# Patient Record
Sex: Female | Born: 1975 | Race: White | Hispanic: No | Marital: Married | Smoking: Never smoker
Health system: Southern US, Community
[De-identification: ages and names within clinical notes are randomized; demographics above are authoritative.]

## PROBLEM LIST (undated history)

## (undated) HISTORY — PX: CHOLECYSTECTOMY: SHX55

## (undated) HISTORY — PX: WISDOM TOOTH EXTRACTION: SHX21

## (undated) HISTORY — PX: TUBAL LIGATION: SHX77

---

## 2002-03-27 ENCOUNTER — Ambulatory Visit (HOSPITAL_COMMUNITY): Admission: RE | Admit: 2002-03-27 | Discharge: 2002-03-27 | Payer: Self-pay | Admitting: Obstetrics and Gynecology

## 2002-03-27 ENCOUNTER — Encounter: Payer: Self-pay | Admitting: Obstetrics and Gynecology

## 2002-04-24 ENCOUNTER — Inpatient Hospital Stay (HOSPITAL_COMMUNITY): Admission: AD | Admit: 2002-04-24 | Discharge: 2002-04-24 | Payer: Self-pay | Admitting: Obstetrics and Gynecology

## 2002-04-24 ENCOUNTER — Encounter: Payer: Self-pay | Admitting: Obstetrics and Gynecology

## 2002-04-24 ENCOUNTER — Ambulatory Visit (HOSPITAL_COMMUNITY): Admission: RE | Admit: 2002-04-24 | Discharge: 2002-04-24 | Payer: Self-pay | Admitting: Obstetrics and Gynecology

## 2002-04-29 ENCOUNTER — Ambulatory Visit (HOSPITAL_COMMUNITY): Admission: RE | Admit: 2002-04-29 | Discharge: 2002-04-29 | Payer: Self-pay | Admitting: Obstetrics and Gynecology

## 2002-06-03 ENCOUNTER — Inpatient Hospital Stay (HOSPITAL_COMMUNITY): Admission: AD | Admit: 2002-06-03 | Discharge: 2002-06-03 | Payer: Self-pay | Admitting: Obstetrics and Gynecology

## 2002-08-01 ENCOUNTER — Inpatient Hospital Stay (HOSPITAL_COMMUNITY): Admission: AD | Admit: 2002-08-01 | Discharge: 2002-08-04 | Payer: Self-pay | Admitting: Obstetrics and Gynecology

## 2002-12-18 ENCOUNTER — Encounter: Payer: Self-pay | Admitting: Family Medicine

## 2002-12-18 ENCOUNTER — Ambulatory Visit (HOSPITAL_COMMUNITY): Admission: RE | Admit: 2002-12-18 | Discharge: 2002-12-18 | Payer: Self-pay | Admitting: Family Medicine

## 2014-09-23 ENCOUNTER — Emergency Department (HOSPITAL_BASED_OUTPATIENT_CLINIC_OR_DEPARTMENT_OTHER)
Admission: EM | Admit: 2014-09-23 | Discharge: 2014-09-23 | Disposition: A | Discharge status: Home / Self Care | Payer: BC Managed Care – PPO | Attending: Emergency Medicine | Admitting: Emergency Medicine

## 2014-09-23 ENCOUNTER — Emergency Department (HOSPITAL_BASED_OUTPATIENT_CLINIC_OR_DEPARTMENT_OTHER): Payer: BC Managed Care – PPO

## 2014-09-23 ENCOUNTER — Encounter (HOSPITAL_BASED_OUTPATIENT_CLINIC_OR_DEPARTMENT_OTHER): Payer: Self-pay | Admitting: *Deleted

## 2014-09-23 DIAGNOSIS — R110 Nausea: Secondary | ICD-10-CM | POA: Insufficient documentation

## 2014-09-23 DIAGNOSIS — R55 Syncope and collapse: Secondary | ICD-10-CM | POA: Diagnosis not present

## 2014-09-23 DIAGNOSIS — R6884 Jaw pain: Secondary | ICD-10-CM | POA: Insufficient documentation

## 2014-09-23 LAB — CBC WITH DIFFERENTIAL/PLATELET
BASOS ABS: 0 10*3/uL (ref 0.0–0.1)
Basophils Relative: 0 % (ref 0–1)
EOS ABS: 0.1 10*3/uL (ref 0.0–0.7)
EOS PCT: 0 % (ref 0–5)
HCT: 45.3 % (ref 36.0–46.0)
Hemoglobin: 15.1 g/dL — ABNORMAL HIGH (ref 12.0–15.0)
LYMPHS ABS: 2.7 10*3/uL (ref 0.7–4.0)
Lymphocytes Relative: 23 % (ref 12–46)
MCH: 26.7 pg (ref 26.0–34.0)
MCHC: 33.3 g/dL (ref 30.0–36.0)
MCV: 80.2 fL (ref 78.0–100.0)
MONO ABS: 0.9 10*3/uL (ref 0.1–1.0)
Monocytes Relative: 8 % (ref 3–12)
Neutro Abs: 8 10*3/uL — ABNORMAL HIGH (ref 1.7–7.7)
Neutrophils Relative %: 69 % (ref 43–77)
Platelets: 299 10*3/uL (ref 150–400)
RBC: 5.65 MIL/uL — ABNORMAL HIGH (ref 3.87–5.11)
RDW: 14.6 % (ref 11.5–15.5)
WBC: 11.7 10*3/uL — ABNORMAL HIGH (ref 4.0–10.5)

## 2014-09-23 LAB — BASIC METABOLIC PANEL
Anion gap: 15 (ref 5–15)
BUN: 16 mg/dL (ref 6–23)
CALCIUM: 9.5 mg/dL (ref 8.4–10.5)
CO2: 21 mEq/L (ref 19–32)
CREATININE: 0.7 mg/dL (ref 0.50–1.10)
Chloride: 102 mEq/L (ref 96–112)
GFR calc Af Amer: >90 mL/min (ref >90)
GLUCOSE: 97 mg/dL (ref 70–99)
Potassium: 3.5 mEq/L — ABNORMAL LOW (ref 3.7–5.3)
SODIUM: 138 meq/L (ref 137–147)

## 2014-09-23 LAB — TROPONIN I

## 2014-09-23 NOTE — ED Provider Notes (Signed)
CSN: [id number]     Arrival date & time 09/23/14  1906 History   First MD Initiated Contact with Patient 09/23/14 1924     Chief Complaint  Patient presents with  . Jaw Pain  . Near Syncope     (Consider location/radiation/quality/duration/timing/severity/associated sxs/prior Treatment) HPI Comments: Patients with history of obesity presents with complaint of near syncope, nausea, and jaw pain approximately 24 hours ago. Patient was working out at [employer] yesterday walking on a treadmill. Patient states that she walked about 3 miles and her heart rate went up to 160. Immediately after completing her workup she felt lightheaded with did not have full syncope. She felt nauseous but did not vomit. She did not have chest pain or shortness of breath. She then went out and ate dinner. After dinner she began to feel nauseous again and had bilateral jaw pain without chest pain or shortness of breath. Symptoms then resolved and have not returned. She became concerned after talking with a coworker today and presents to the emergency department for evaluation. Patient has no risk factors for pulmonary embolism and no history of pulmonary embolism. She does not have a history of hypertension, diabetes, high cholesterol, smoking. Patient has family members on her father's side who had heart disease in their 2260s and 3270s. She has never had symptoms like this while exercising. She has lost 30 pounds with exercising over the past several months.  Patient is a 38 y.o. female presenting with near-syncope. The history is provided by the patient.  Near Syncope Associated symptoms include nausea. Pertinent negatives include no abdominal pain, chest pain, coughing, diaphoresis, fever, neck pain, rash or vomiting.    History reviewed. No pertinent past medical history. Past Surgical History  Procedure Laterality Date  . Cholecystectomy    . Tubal ligation    . Wisdom tooth extraction     No family history on  file. History  Substance Use Topics  . Smoking status: Never Smoker   . Smokeless tobacco: Not on file  . Alcohol Use: No   OB History    No data available     Review of Systems  Constitutional: Negative for fever and diaphoresis.  Eyes: Negative for redness.  Respiratory: Negative for cough and shortness of breath.   Cardiovascular: Positive for near-syncope. Negative for chest pain, palpitations and leg swelling.       Jaw pain  Gastrointestinal: Positive for nausea. Negative for vomiting and abdominal pain.  Genitourinary: Negative for dysuria.  Musculoskeletal: Negative for back pain and neck pain.  Skin: Negative for rash.  Neurological: Positive for light-headedness. Negative for syncope.      Allergies  Review of patient's allergies indicates no known allergies.  Home Medications   Prior to Admission medications   Not on File   BP 147/87 mmHg  Pulse 74  Temp(Src) 98.1 F (36.7 C) (Oral)  Resp 18  Ht 5\' 3"  (1.6 m)  Wt 250 lb (113.399 kg)  BMI 44.30 kg/m2  SpO2 100%  LMP 09/03/2014   Physical Exam  Constitutional: She appears well-developed and well-nourished.  HENT:  Head: Normocephalic and atraumatic.  Mouth/Throat: Mucous membranes are normal. Mucous membranes are not dry.  Eyes: Conjunctivae are normal.  Neck: Trachea normal and normal range of motion. Neck supple. Normal carotid pulses and no JVD present. No muscular tenderness present. Carotid bruit is not present. No tracheal deviation present.  Cardiovascular: Normal rate, regular rhythm, S1 normal, S2 normal, normal heart sounds and intact distal  pulses.  Exam reveals no decreased pulses.   No murmur heard. Pulmonary/Chest: Effort normal. No respiratory distress. She has no wheezes. She exhibits no tenderness.  Abdominal: Soft. Normal aorta and bowel sounds are normal. There is no tenderness. There is no rebound and no guarding.  Obese  Musculoskeletal: Normal range of motion. She exhibits no  edema or tenderness.  Neurological: She is alert.  Skin: Skin is warm and dry. She is not diaphoretic. No cyanosis. No pallor.  Psychiatric: She has a normal mood and affect.  Nursing note and vitals reviewed.   ED Course  Procedures (including critical care time) Labs Review Labs Reviewed  CBC WITH DIFFERENTIAL - Abnormal; Notable for the following:    WBC 11.7 (*)    RBC 5.65 (*)    Hemoglobin 15.1 (*)    Neutro Abs 8.0 (*)    All other components within normal limits  BASIC METABOLIC PANEL - Abnormal; Notable for the following:    Potassium 3.5 (*)    All other components within normal limits  TROPONIN I    Imaging Review Dg Chest 2 View  09/23/2014   CLINICAL DATA:  Nauseated, dizziness after going to the gym yesterday.  EXAM: CHEST  2 VIEW  COMPARISON:  None.  FINDINGS: The heart size and mediastinal contours are within normal limits. Both lungs are clear. The visualized skeletal structures are unremarkable. Surgical clips in the included right abdomen likely reflect cholecystectomy.  IMPRESSION: No acute cardiopulmonary process ; normal chest radiograph.   Electronically Signed   By: Awilda Metroourtnay  Bloomer   On: 09/23/2014 21:13     EKG Interpretation   Date/Time:  Thursday September 23 2014 20:09:56 EST Ventricular Rate:  72 PR Interval:  138 QRS Duration: 102 QT Interval:  422 QTC Calculation: 462 R Axis:   90 Text Interpretation:  Normal sinus rhythm Rightward axis Borderline ECG No  previous ECGs available Confirmed by YAO  MD, DAVID ([id number]) on 09/23/2014  8:58:50 PM       7:51 PM Patient seen and examined. Work-up initiated.    Vital signs reviewed and are as follows: BP 147/87 mmHg  Pulse 74  Temp(Src) 98.1 F (36.7 C) (Oral)  Resp 18  Ht 5\' 3"  (1.6 m)  Wt 250 lb (113.399 kg)  BMI 44.30 kg/m2  SpO2 100%  LMP 09/03/2014  9:38 PM Patient discussed with Dr. Silverio LayYao. Pt informed of results.   Will have patient follow-up with her primary care physician before  resuming physical activities.  Patient was counseled to return with severe chest pain, especially if the pain is crushing or pressure-like and spreads to the arms, back, neck, or jaw, or if they have sweating, nausea, or shortness of breath with the pain. They were encouraged to call 911 with these symptoms.   They were also told to return if their chest pain gets worse and does not go away with rest, they have an attack of chest pain lasting longer than usual despite rest and treatment with the medications their caregiver has prescribed, if they wake from sleep with chest pain or shortness of breath, if they feel dizzy or faint, if they have chest pain not typical of their usual pain, or if they have any other emergent concerns regarding their health.  The patient verbalized understanding and agreed.    MDM   Final diagnoses:  Near syncope  Jaw pain   Patient with nausea, lightheadedness, jaw pain after exercise but not during exercise. Other than obesity,  patient has no significant risk factors for ACS. Workup is negative. EKG is normal. Troponin at approximately 24 hours after pain is negative. Heart score of 1. Feel patient can be safely discharged to home. She is not to resume physical activity until she is seen by her primary care physician and proper clearance is performed.   Renne Crigler, PA-C 09/23/14 2140  Richardean Canal, MD 09/23/14 [phone]

## 2014-09-23 NOTE — Discharge Instructions (Signed)
Please read and follow all provided instructions.  Your diagnoses today include:  1. Jaw pain   2. Near syncope     Tests performed today include:  An EKG of your heart - normal  A chest x-ray - normal  Cardiac enzymes - a blood test for heart muscle damage, no sign of heart attack  Blood counts and electrolytes  Vital signs. See below for your results today.   Medications prescribed:   None  Take any prescribed medications only as directed.  Follow-up instructions: Please follow-up with your primary care provider as soon as you can for further evaluation of your symptoms. Do not re-start physical activity until cleared by your doctor.   Return instructions:  SEEK IMMEDIATE MEDICAL ATTENTION IF:  You have severe chest pain, especially if the pain is crushing or pressure-like and spreads to the arms, back, neck, or jaw, or if you have sweating, nausea (feeling sick to your stomach), or shortness of breath. THIS IS AN EMERGENCY. Don't wait to see if the pain will go away. Get medical help at once. Call 911 or [phone] (operator). DO NOT drive yourself to the hospital.   Your chest pain gets worse and does not go away with rest.   You have an attack of chest pain lasting longer than usual, despite rest and treatment with the medications your caregiver has prescribed.   You wake from sleep with chest pain or shortness of breath.  You feel dizzy or faint.  You have chest pain not typical of your usual pain for which you originally saw your caregiver.   You have any other emergent concerns regarding your health.  Additional Information: Chest pain comes from many different causes. Your caregiver has diagnosed you as having chest pain that is not specific for one problem, but does not require admission.  You are at low risk for an acute heart condition or other serious illness.   Your vital signs today were: BP 130/71 mmHg   Pulse 65   Temp(Src) 98.1 F (36.7 C) (Oral)   Resp 18    Ht 5\' 3"  (1.6 m)   Wt 250 lb (113.399 kg)   BMI 44.30 kg/m2   SpO2 100%   LMP 09/03/2014 If your blood pressure (BP) was elevated above 135/85 this visit, please have this repeated by your doctor within one month. --------------

## 2014-09-23 NOTE — ED Notes (Signed)
Yesterday at the GYM she walked 3 miles. Afterward she got nauseated and dizzy. Jaw pain afterward. Today she has been fine.

## 2014-09-23 NOTE — ED Notes (Signed)
PA at bedside.

## 2016-02-13 DIAGNOSIS — M436 Torticollis: Secondary | ICD-10-CM | POA: Diagnosis not present

## 2016-03-07 DIAGNOSIS — M5033 Other cervical disc degeneration, cervicothoracic region: Secondary | ICD-10-CM | POA: Diagnosis not present

## 2016-03-07 DIAGNOSIS — M9901 Segmental and somatic dysfunction of cervical region: Secondary | ICD-10-CM | POA: Diagnosis not present

## 2016-03-08 DIAGNOSIS — M9901 Segmental and somatic dysfunction of cervical region: Secondary | ICD-10-CM | POA: Diagnosis not present

## 2016-03-08 DIAGNOSIS — M5033 Other cervical disc degeneration, cervicothoracic region: Secondary | ICD-10-CM | POA: Diagnosis not present

## 2016-03-12 DIAGNOSIS — M5033 Other cervical disc degeneration, cervicothoracic region: Secondary | ICD-10-CM | POA: Diagnosis not present

## 2016-03-12 DIAGNOSIS — M9901 Segmental and somatic dysfunction of cervical region: Secondary | ICD-10-CM | POA: Diagnosis not present

## 2016-03-13 DIAGNOSIS — M9901 Segmental and somatic dysfunction of cervical region: Secondary | ICD-10-CM | POA: Diagnosis not present

## 2016-03-13 DIAGNOSIS — M5033 Other cervical disc degeneration, cervicothoracic region: Secondary | ICD-10-CM | POA: Diagnosis not present

## 2016-03-14 DIAGNOSIS — M5033 Other cervical disc degeneration, cervicothoracic region: Secondary | ICD-10-CM | POA: Diagnosis not present

## 2016-03-14 DIAGNOSIS — M9901 Segmental and somatic dysfunction of cervical region: Secondary | ICD-10-CM | POA: Diagnosis not present

## 2016-03-15 DIAGNOSIS — M5033 Other cervical disc degeneration, cervicothoracic region: Secondary | ICD-10-CM | POA: Diagnosis not present

## 2016-03-15 DIAGNOSIS — M9901 Segmental and somatic dysfunction of cervical region: Secondary | ICD-10-CM | POA: Diagnosis not present

## 2016-03-19 DIAGNOSIS — M9901 Segmental and somatic dysfunction of cervical region: Secondary | ICD-10-CM | POA: Diagnosis not present

## 2016-03-19 DIAGNOSIS — M5033 Other cervical disc degeneration, cervicothoracic region: Secondary | ICD-10-CM | POA: Diagnosis not present

## 2016-03-20 DIAGNOSIS — M9901 Segmental and somatic dysfunction of cervical region: Secondary | ICD-10-CM | POA: Diagnosis not present

## 2016-03-20 DIAGNOSIS — M5033 Other cervical disc degeneration, cervicothoracic region: Secondary | ICD-10-CM | POA: Diagnosis not present

## 2016-03-21 DIAGNOSIS — M9901 Segmental and somatic dysfunction of cervical region: Secondary | ICD-10-CM | POA: Diagnosis not present

## 2016-03-21 DIAGNOSIS — M5033 Other cervical disc degeneration, cervicothoracic region: Secondary | ICD-10-CM | POA: Diagnosis not present

## 2016-03-22 DIAGNOSIS — M5033 Other cervical disc degeneration, cervicothoracic region: Secondary | ICD-10-CM | POA: Diagnosis not present

## 2016-03-22 DIAGNOSIS — M9901 Segmental and somatic dysfunction of cervical region: Secondary | ICD-10-CM | POA: Diagnosis not present

## 2016-04-02 DIAGNOSIS — M5033 Other cervical disc degeneration, cervicothoracic region: Secondary | ICD-10-CM | POA: Diagnosis not present

## 2016-04-02 DIAGNOSIS — M9901 Segmental and somatic dysfunction of cervical region: Secondary | ICD-10-CM | POA: Diagnosis not present

## 2016-04-03 DIAGNOSIS — M9901 Segmental and somatic dysfunction of cervical region: Secondary | ICD-10-CM | POA: Diagnosis not present

## 2016-04-03 DIAGNOSIS — M5033 Other cervical disc degeneration, cervicothoracic region: Secondary | ICD-10-CM | POA: Diagnosis not present

## 2016-04-04 DIAGNOSIS — M9901 Segmental and somatic dysfunction of cervical region: Secondary | ICD-10-CM | POA: Diagnosis not present

## 2016-04-04 DIAGNOSIS — M5033 Other cervical disc degeneration, cervicothoracic region: Secondary | ICD-10-CM | POA: Diagnosis not present

## 2016-04-05 DIAGNOSIS — M9901 Segmental and somatic dysfunction of cervical region: Secondary | ICD-10-CM | POA: Diagnosis not present

## 2016-04-05 DIAGNOSIS — M5033 Other cervical disc degeneration, cervicothoracic region: Secondary | ICD-10-CM | POA: Diagnosis not present

## 2016-04-16 DIAGNOSIS — M9901 Segmental and somatic dysfunction of cervical region: Secondary | ICD-10-CM | POA: Diagnosis not present

## 2016-04-16 DIAGNOSIS — M5033 Other cervical disc degeneration, cervicothoracic region: Secondary | ICD-10-CM | POA: Diagnosis not present

## 2016-04-17 DIAGNOSIS — M5033 Other cervical disc degeneration, cervicothoracic region: Secondary | ICD-10-CM | POA: Diagnosis not present

## 2016-04-17 DIAGNOSIS — M9901 Segmental and somatic dysfunction of cervical region: Secondary | ICD-10-CM | POA: Diagnosis not present

## 2016-04-18 DIAGNOSIS — M9901 Segmental and somatic dysfunction of cervical region: Secondary | ICD-10-CM | POA: Diagnosis not present

## 2016-04-18 DIAGNOSIS — M5033 Other cervical disc degeneration, cervicothoracic region: Secondary | ICD-10-CM | POA: Diagnosis not present

## 2016-04-19 DIAGNOSIS — M5033 Other cervical disc degeneration, cervicothoracic region: Secondary | ICD-10-CM | POA: Diagnosis not present

## 2016-04-19 DIAGNOSIS — M9901 Segmental and somatic dysfunction of cervical region: Secondary | ICD-10-CM | POA: Diagnosis not present

## 2016-04-23 DIAGNOSIS — M5033 Other cervical disc degeneration, cervicothoracic region: Secondary | ICD-10-CM | POA: Diagnosis not present

## 2016-04-23 DIAGNOSIS — M9901 Segmental and somatic dysfunction of cervical region: Secondary | ICD-10-CM | POA: Diagnosis not present

## 2016-04-25 DIAGNOSIS — M5033 Other cervical disc degeneration, cervicothoracic region: Secondary | ICD-10-CM | POA: Diagnosis not present

## 2016-04-25 DIAGNOSIS — M9901 Segmental and somatic dysfunction of cervical region: Secondary | ICD-10-CM | POA: Diagnosis not present

## 2016-04-26 DIAGNOSIS — M5033 Other cervical disc degeneration, cervicothoracic region: Secondary | ICD-10-CM | POA: Diagnosis not present

## 2016-04-26 DIAGNOSIS — M9901 Segmental and somatic dysfunction of cervical region: Secondary | ICD-10-CM | POA: Diagnosis not present

## 2016-05-02 DIAGNOSIS — M9901 Segmental and somatic dysfunction of cervical region: Secondary | ICD-10-CM | POA: Diagnosis not present

## 2016-05-02 DIAGNOSIS — M5033 Other cervical disc degeneration, cervicothoracic region: Secondary | ICD-10-CM | POA: Diagnosis not present

## 2016-05-03 DIAGNOSIS — M9901 Segmental and somatic dysfunction of cervical region: Secondary | ICD-10-CM | POA: Diagnosis not present

## 2016-05-03 DIAGNOSIS — M5033 Other cervical disc degeneration, cervicothoracic region: Secondary | ICD-10-CM | POA: Diagnosis not present

## 2016-05-07 DIAGNOSIS — M5033 Other cervical disc degeneration, cervicothoracic region: Secondary | ICD-10-CM | POA: Diagnosis not present

## 2016-05-07 DIAGNOSIS — M9901 Segmental and somatic dysfunction of cervical region: Secondary | ICD-10-CM | POA: Diagnosis not present

## 2016-05-09 DIAGNOSIS — M5033 Other cervical disc degeneration, cervicothoracic region: Secondary | ICD-10-CM | POA: Diagnosis not present

## 2016-05-09 DIAGNOSIS — M9901 Segmental and somatic dysfunction of cervical region: Secondary | ICD-10-CM | POA: Diagnosis not present

## 2016-05-10 DIAGNOSIS — M9901 Segmental and somatic dysfunction of cervical region: Secondary | ICD-10-CM | POA: Diagnosis not present

## 2016-05-10 DIAGNOSIS — M5033 Other cervical disc degeneration, cervicothoracic region: Secondary | ICD-10-CM | POA: Diagnosis not present

## 2016-05-14 DIAGNOSIS — M5033 Other cervical disc degeneration, cervicothoracic region: Secondary | ICD-10-CM | POA: Diagnosis not present

## 2016-05-14 DIAGNOSIS — M9901 Segmental and somatic dysfunction of cervical region: Secondary | ICD-10-CM | POA: Diagnosis not present

## 2016-05-16 DIAGNOSIS — M5033 Other cervical disc degeneration, cervicothoracic region: Secondary | ICD-10-CM | POA: Diagnosis not present

## 2016-05-16 DIAGNOSIS — M9901 Segmental and somatic dysfunction of cervical region: Secondary | ICD-10-CM | POA: Diagnosis not present

## 2016-05-17 DIAGNOSIS — M5033 Other cervical disc degeneration, cervicothoracic region: Secondary | ICD-10-CM | POA: Diagnosis not present

## 2016-05-17 DIAGNOSIS — M9901 Segmental and somatic dysfunction of cervical region: Secondary | ICD-10-CM | POA: Diagnosis not present

## 2016-05-21 DIAGNOSIS — M5033 Other cervical disc degeneration, cervicothoracic region: Secondary | ICD-10-CM | POA: Diagnosis not present

## 2016-05-21 DIAGNOSIS — M9901 Segmental and somatic dysfunction of cervical region: Secondary | ICD-10-CM | POA: Diagnosis not present

## 2016-05-23 DIAGNOSIS — M9901 Segmental and somatic dysfunction of cervical region: Secondary | ICD-10-CM | POA: Diagnosis not present

## 2016-05-23 DIAGNOSIS — M5033 Other cervical disc degeneration, cervicothoracic region: Secondary | ICD-10-CM | POA: Diagnosis not present

## 2016-05-24 DIAGNOSIS — M5033 Other cervical disc degeneration, cervicothoracic region: Secondary | ICD-10-CM | POA: Diagnosis not present

## 2016-05-24 DIAGNOSIS — M9901 Segmental and somatic dysfunction of cervical region: Secondary | ICD-10-CM | POA: Diagnosis not present

## 2016-05-28 DIAGNOSIS — M5033 Other cervical disc degeneration, cervicothoracic region: Secondary | ICD-10-CM | POA: Diagnosis not present

## 2016-05-28 DIAGNOSIS — M9901 Segmental and somatic dysfunction of cervical region: Secondary | ICD-10-CM | POA: Diagnosis not present

## 2016-06-07 DIAGNOSIS — M5033 Other cervical disc degeneration, cervicothoracic region: Secondary | ICD-10-CM | POA: Diagnosis not present

## 2016-06-07 DIAGNOSIS — M9901 Segmental and somatic dysfunction of cervical region: Secondary | ICD-10-CM | POA: Diagnosis not present

## 2016-06-12 DIAGNOSIS — M5033 Other cervical disc degeneration, cervicothoracic region: Secondary | ICD-10-CM | POA: Diagnosis not present

## 2016-06-12 DIAGNOSIS — M9901 Segmental and somatic dysfunction of cervical region: Secondary | ICD-10-CM | POA: Diagnosis not present

## 2016-06-13 DIAGNOSIS — M9901 Segmental and somatic dysfunction of cervical region: Secondary | ICD-10-CM | POA: Diagnosis not present

## 2016-06-13 DIAGNOSIS — M5033 Other cervical disc degeneration, cervicothoracic region: Secondary | ICD-10-CM | POA: Diagnosis not present

## 2016-06-14 DIAGNOSIS — M9901 Segmental and somatic dysfunction of cervical region: Secondary | ICD-10-CM | POA: Diagnosis not present

## 2016-06-14 DIAGNOSIS — M5033 Other cervical disc degeneration, cervicothoracic region: Secondary | ICD-10-CM | POA: Diagnosis not present

## 2016-06-18 DIAGNOSIS — M9901 Segmental and somatic dysfunction of cervical region: Secondary | ICD-10-CM | POA: Diagnosis not present

## 2016-06-18 DIAGNOSIS — M5033 Other cervical disc degeneration, cervicothoracic region: Secondary | ICD-10-CM | POA: Diagnosis not present

## 2016-06-20 DIAGNOSIS — M5033 Other cervical disc degeneration, cervicothoracic region: Secondary | ICD-10-CM | POA: Diagnosis not present

## 2016-06-20 DIAGNOSIS — M9901 Segmental and somatic dysfunction of cervical region: Secondary | ICD-10-CM | POA: Diagnosis not present

## 2016-06-21 DIAGNOSIS — M5033 Other cervical disc degeneration, cervicothoracic region: Secondary | ICD-10-CM | POA: Diagnosis not present

## 2016-06-21 DIAGNOSIS — M9901 Segmental and somatic dysfunction of cervical region: Secondary | ICD-10-CM | POA: Diagnosis not present

## 2016-06-25 DIAGNOSIS — M9901 Segmental and somatic dysfunction of cervical region: Secondary | ICD-10-CM | POA: Diagnosis not present

## 2016-06-25 DIAGNOSIS — M5033 Other cervical disc degeneration, cervicothoracic region: Secondary | ICD-10-CM | POA: Diagnosis not present

## 2016-06-28 DIAGNOSIS — M9901 Segmental and somatic dysfunction of cervical region: Secondary | ICD-10-CM | POA: Diagnosis not present

## 2016-06-28 DIAGNOSIS — M5033 Other cervical disc degeneration, cervicothoracic region: Secondary | ICD-10-CM | POA: Diagnosis not present

## 2016-07-02 DIAGNOSIS — M9901 Segmental and somatic dysfunction of cervical region: Secondary | ICD-10-CM | POA: Diagnosis not present

## 2016-07-02 DIAGNOSIS — M5033 Other cervical disc degeneration, cervicothoracic region: Secondary | ICD-10-CM | POA: Diagnosis not present

## 2016-07-05 DIAGNOSIS — M9901 Segmental and somatic dysfunction of cervical region: Secondary | ICD-10-CM | POA: Diagnosis not present

## 2016-07-05 DIAGNOSIS — M5033 Other cervical disc degeneration, cervicothoracic region: Secondary | ICD-10-CM | POA: Diagnosis not present

## 2016-07-10 DIAGNOSIS — M9901 Segmental and somatic dysfunction of cervical region: Secondary | ICD-10-CM | POA: Diagnosis not present

## 2016-07-10 DIAGNOSIS — M5033 Other cervical disc degeneration, cervicothoracic region: Secondary | ICD-10-CM | POA: Diagnosis not present

## 2016-07-12 DIAGNOSIS — M5033 Other cervical disc degeneration, cervicothoracic region: Secondary | ICD-10-CM | POA: Diagnosis not present

## 2016-07-12 DIAGNOSIS — M9901 Segmental and somatic dysfunction of cervical region: Secondary | ICD-10-CM | POA: Diagnosis not present

## 2016-07-16 DIAGNOSIS — M9901 Segmental and somatic dysfunction of cervical region: Secondary | ICD-10-CM | POA: Diagnosis not present

## 2016-07-16 DIAGNOSIS — M5033 Other cervical disc degeneration, cervicothoracic region: Secondary | ICD-10-CM | POA: Diagnosis not present

## 2016-07-18 DIAGNOSIS — M5033 Other cervical disc degeneration, cervicothoracic region: Secondary | ICD-10-CM | POA: Diagnosis not present

## 2016-07-18 DIAGNOSIS — M9901 Segmental and somatic dysfunction of cervical region: Secondary | ICD-10-CM | POA: Diagnosis not present

## 2016-07-23 DIAGNOSIS — M5033 Other cervical disc degeneration, cervicothoracic region: Secondary | ICD-10-CM | POA: Diagnosis not present

## 2016-07-23 DIAGNOSIS — M9901 Segmental and somatic dysfunction of cervical region: Secondary | ICD-10-CM | POA: Diagnosis not present

## 2016-07-26 DIAGNOSIS — M9901 Segmental and somatic dysfunction of cervical region: Secondary | ICD-10-CM | POA: Diagnosis not present

## 2016-07-26 DIAGNOSIS — M5033 Other cervical disc degeneration, cervicothoracic region: Secondary | ICD-10-CM | POA: Diagnosis not present

## 2016-08-01 DIAGNOSIS — M9901 Segmental and somatic dysfunction of cervical region: Secondary | ICD-10-CM | POA: Diagnosis not present

## 2016-08-01 DIAGNOSIS — M5033 Other cervical disc degeneration, cervicothoracic region: Secondary | ICD-10-CM | POA: Diagnosis not present

## 2016-08-08 DIAGNOSIS — M5033 Other cervical disc degeneration, cervicothoracic region: Secondary | ICD-10-CM | POA: Diagnosis not present

## 2016-08-08 DIAGNOSIS — M9901 Segmental and somatic dysfunction of cervical region: Secondary | ICD-10-CM | POA: Diagnosis not present

## 2016-08-13 DIAGNOSIS — M5033 Other cervical disc degeneration, cervicothoracic region: Secondary | ICD-10-CM | POA: Diagnosis not present

## 2016-08-13 DIAGNOSIS — M9901 Segmental and somatic dysfunction of cervical region: Secondary | ICD-10-CM | POA: Diagnosis not present

## 2016-08-14 DIAGNOSIS — M5033 Other cervical disc degeneration, cervicothoracic region: Secondary | ICD-10-CM | POA: Diagnosis not present

## 2016-08-14 DIAGNOSIS — M9901 Segmental and somatic dysfunction of cervical region: Secondary | ICD-10-CM | POA: Diagnosis not present

## 2016-08-15 DIAGNOSIS — M5033 Other cervical disc degeneration, cervicothoracic region: Secondary | ICD-10-CM | POA: Diagnosis not present

## 2016-08-15 DIAGNOSIS — M9901 Segmental and somatic dysfunction of cervical region: Secondary | ICD-10-CM | POA: Diagnosis not present

## 2016-08-22 DIAGNOSIS — M5033 Other cervical disc degeneration, cervicothoracic region: Secondary | ICD-10-CM | POA: Diagnosis not present

## 2016-08-22 DIAGNOSIS — M9901 Segmental and somatic dysfunction of cervical region: Secondary | ICD-10-CM | POA: Diagnosis not present

## 2016-08-27 DIAGNOSIS — M5033 Other cervical disc degeneration, cervicothoracic region: Secondary | ICD-10-CM | POA: Diagnosis not present

## 2016-08-27 DIAGNOSIS — M9901 Segmental and somatic dysfunction of cervical region: Secondary | ICD-10-CM | POA: Diagnosis not present

## 2016-09-05 DIAGNOSIS — M5033 Other cervical disc degeneration, cervicothoracic region: Secondary | ICD-10-CM | POA: Diagnosis not present

## 2016-09-05 DIAGNOSIS — M9901 Segmental and somatic dysfunction of cervical region: Secondary | ICD-10-CM | POA: Diagnosis not present

## 2016-09-12 DIAGNOSIS — M9901 Segmental and somatic dysfunction of cervical region: Secondary | ICD-10-CM | POA: Diagnosis not present

## 2016-09-12 DIAGNOSIS — M5033 Other cervical disc degeneration, cervicothoracic region: Secondary | ICD-10-CM | POA: Diagnosis not present

## 2016-09-19 DIAGNOSIS — M5033 Other cervical disc degeneration, cervicothoracic region: Secondary | ICD-10-CM | POA: Diagnosis not present

## 2016-09-19 DIAGNOSIS — M9901 Segmental and somatic dysfunction of cervical region: Secondary | ICD-10-CM | POA: Diagnosis not present

## 2016-10-10 DIAGNOSIS — M5033 Other cervical disc degeneration, cervicothoracic region: Secondary | ICD-10-CM | POA: Diagnosis not present

## 2016-10-10 DIAGNOSIS — M9901 Segmental and somatic dysfunction of cervical region: Secondary | ICD-10-CM | POA: Diagnosis not present

## 2016-10-17 DIAGNOSIS — M5033 Other cervical disc degeneration, cervicothoracic region: Secondary | ICD-10-CM | POA: Diagnosis not present

## 2016-10-17 DIAGNOSIS — M9901 Segmental and somatic dysfunction of cervical region: Secondary | ICD-10-CM | POA: Diagnosis not present

## 2016-10-31 DIAGNOSIS — M5033 Other cervical disc degeneration, cervicothoracic region: Secondary | ICD-10-CM | POA: Diagnosis not present

## 2016-10-31 DIAGNOSIS — M9901 Segmental and somatic dysfunction of cervical region: Secondary | ICD-10-CM | POA: Diagnosis not present

## 2016-11-12 DIAGNOSIS — L309 Dermatitis, unspecified: Secondary | ICD-10-CM | POA: Diagnosis not present

## 2016-11-14 DIAGNOSIS — M9901 Segmental and somatic dysfunction of cervical region: Secondary | ICD-10-CM | POA: Diagnosis not present

## 2016-11-14 DIAGNOSIS — M5033 Other cervical disc degeneration, cervicothoracic region: Secondary | ICD-10-CM | POA: Diagnosis not present

## 2016-11-20 DIAGNOSIS — M9901 Segmental and somatic dysfunction of cervical region: Secondary | ICD-10-CM | POA: Diagnosis not present

## 2016-11-20 DIAGNOSIS — M5033 Other cervical disc degeneration, cervicothoracic region: Secondary | ICD-10-CM | POA: Diagnosis not present

## 2016-11-21 DIAGNOSIS — M5033 Other cervical disc degeneration, cervicothoracic region: Secondary | ICD-10-CM | POA: Diagnosis not present

## 2016-11-21 DIAGNOSIS — M9901 Segmental and somatic dysfunction of cervical region: Secondary | ICD-10-CM | POA: Diagnosis not present

## 2016-11-27 DIAGNOSIS — M9901 Segmental and somatic dysfunction of cervical region: Secondary | ICD-10-CM | POA: Diagnosis not present

## 2016-11-27 DIAGNOSIS — M5033 Other cervical disc degeneration, cervicothoracic region: Secondary | ICD-10-CM | POA: Diagnosis not present

## 2016-11-28 DIAGNOSIS — M5033 Other cervical disc degeneration, cervicothoracic region: Secondary | ICD-10-CM | POA: Diagnosis not present

## 2016-11-28 DIAGNOSIS — M9901 Segmental and somatic dysfunction of cervical region: Secondary | ICD-10-CM | POA: Diagnosis not present

## 2016-12-05 DIAGNOSIS — M5033 Other cervical disc degeneration, cervicothoracic region: Secondary | ICD-10-CM | POA: Diagnosis not present

## 2016-12-05 DIAGNOSIS — M9901 Segmental and somatic dysfunction of cervical region: Secondary | ICD-10-CM | POA: Diagnosis not present

## 2016-12-06 DIAGNOSIS — M9901 Segmental and somatic dysfunction of cervical region: Secondary | ICD-10-CM | POA: Diagnosis not present

## 2016-12-06 DIAGNOSIS — M5033 Other cervical disc degeneration, cervicothoracic region: Secondary | ICD-10-CM | POA: Diagnosis not present

## 2016-12-12 DIAGNOSIS — M9901 Segmental and somatic dysfunction of cervical region: Secondary | ICD-10-CM | POA: Diagnosis not present

## 2016-12-12 DIAGNOSIS — M5033 Other cervical disc degeneration, cervicothoracic region: Secondary | ICD-10-CM | POA: Diagnosis not present

## 2016-12-19 DIAGNOSIS — M9901 Segmental and somatic dysfunction of cervical region: Secondary | ICD-10-CM | POA: Diagnosis not present

## 2016-12-19 DIAGNOSIS — M5033 Other cervical disc degeneration, cervicothoracic region: Secondary | ICD-10-CM | POA: Diagnosis not present

## 2016-12-26 DIAGNOSIS — M9901 Segmental and somatic dysfunction of cervical region: Secondary | ICD-10-CM | POA: Diagnosis not present

## 2016-12-26 DIAGNOSIS — M5033 Other cervical disc degeneration, cervicothoracic region: Secondary | ICD-10-CM | POA: Diagnosis not present

## 2017-01-02 DIAGNOSIS — M9901 Segmental and somatic dysfunction of cervical region: Secondary | ICD-10-CM | POA: Diagnosis not present

## 2017-01-02 DIAGNOSIS — M5033 Other cervical disc degeneration, cervicothoracic region: Secondary | ICD-10-CM | POA: Diagnosis not present

## 2017-01-09 DIAGNOSIS — M5033 Other cervical disc degeneration, cervicothoracic region: Secondary | ICD-10-CM | POA: Diagnosis not present

## 2017-01-09 DIAGNOSIS — M9901 Segmental and somatic dysfunction of cervical region: Secondary | ICD-10-CM | POA: Diagnosis not present

## 2017-01-16 DIAGNOSIS — M9901 Segmental and somatic dysfunction of cervical region: Secondary | ICD-10-CM | POA: Diagnosis not present

## 2017-01-16 DIAGNOSIS — M5033 Other cervical disc degeneration, cervicothoracic region: Secondary | ICD-10-CM | POA: Diagnosis not present

## 2017-01-23 DIAGNOSIS — M5033 Other cervical disc degeneration, cervicothoracic region: Secondary | ICD-10-CM | POA: Diagnosis not present

## 2017-01-23 DIAGNOSIS — M9901 Segmental and somatic dysfunction of cervical region: Secondary | ICD-10-CM | POA: Diagnosis not present

## 2017-01-30 DIAGNOSIS — M5033 Other cervical disc degeneration, cervicothoracic region: Secondary | ICD-10-CM | POA: Diagnosis not present

## 2017-01-30 DIAGNOSIS — M9901 Segmental and somatic dysfunction of cervical region: Secondary | ICD-10-CM | POA: Diagnosis not present

## 2017-02-07 DIAGNOSIS — M5033 Other cervical disc degeneration, cervicothoracic region: Secondary | ICD-10-CM | POA: Diagnosis not present

## 2017-02-07 DIAGNOSIS — M9901 Segmental and somatic dysfunction of cervical region: Secondary | ICD-10-CM | POA: Diagnosis not present

## 2017-02-13 DIAGNOSIS — M5033 Other cervical disc degeneration, cervicothoracic region: Secondary | ICD-10-CM | POA: Diagnosis not present

## 2017-02-13 DIAGNOSIS — M9901 Segmental and somatic dysfunction of cervical region: Secondary | ICD-10-CM | POA: Diagnosis not present

## 2017-02-20 DIAGNOSIS — M5033 Other cervical disc degeneration, cervicothoracic region: Secondary | ICD-10-CM | POA: Diagnosis not present

## 2017-02-20 DIAGNOSIS — M9901 Segmental and somatic dysfunction of cervical region: Secondary | ICD-10-CM | POA: Diagnosis not present

## 2017-02-27 DIAGNOSIS — M5033 Other cervical disc degeneration, cervicothoracic region: Secondary | ICD-10-CM | POA: Diagnosis not present

## 2017-02-27 DIAGNOSIS — M9901 Segmental and somatic dysfunction of cervical region: Secondary | ICD-10-CM | POA: Diagnosis not present

## 2017-03-06 DIAGNOSIS — M9901 Segmental and somatic dysfunction of cervical region: Secondary | ICD-10-CM | POA: Diagnosis not present

## 2017-03-06 DIAGNOSIS — M5033 Other cervical disc degeneration, cervicothoracic region: Secondary | ICD-10-CM | POA: Diagnosis not present

## 2017-03-20 DIAGNOSIS — M9901 Segmental and somatic dysfunction of cervical region: Secondary | ICD-10-CM | POA: Diagnosis not present

## 2017-03-20 DIAGNOSIS — M5033 Other cervical disc degeneration, cervicothoracic region: Secondary | ICD-10-CM | POA: Diagnosis not present

## 2017-04-03 DIAGNOSIS — M9901 Segmental and somatic dysfunction of cervical region: Secondary | ICD-10-CM | POA: Diagnosis not present

## 2017-04-03 DIAGNOSIS — M5033 Other cervical disc degeneration, cervicothoracic region: Secondary | ICD-10-CM | POA: Diagnosis not present

## 2017-04-17 DIAGNOSIS — M9901 Segmental and somatic dysfunction of cervical region: Secondary | ICD-10-CM | POA: Diagnosis not present

## 2017-04-17 DIAGNOSIS — M5033 Other cervical disc degeneration, cervicothoracic region: Secondary | ICD-10-CM | POA: Diagnosis not present

## 2017-05-01 DIAGNOSIS — M9901 Segmental and somatic dysfunction of cervical region: Secondary | ICD-10-CM | POA: Diagnosis not present

## 2017-05-01 DIAGNOSIS — M5033 Other cervical disc degeneration, cervicothoracic region: Secondary | ICD-10-CM | POA: Diagnosis not present

## 2017-05-16 DIAGNOSIS — M5033 Other cervical disc degeneration, cervicothoracic region: Secondary | ICD-10-CM | POA: Diagnosis not present

## 2017-05-16 DIAGNOSIS — M9901 Segmental and somatic dysfunction of cervical region: Secondary | ICD-10-CM | POA: Diagnosis not present

## 2017-05-29 DIAGNOSIS — M5033 Other cervical disc degeneration, cervicothoracic region: Secondary | ICD-10-CM | POA: Diagnosis not present

## 2017-05-29 DIAGNOSIS — M9901 Segmental and somatic dysfunction of cervical region: Secondary | ICD-10-CM | POA: Diagnosis not present

## 2017-06-12 DIAGNOSIS — M9901 Segmental and somatic dysfunction of cervical region: Secondary | ICD-10-CM | POA: Diagnosis not present

## 2017-06-12 DIAGNOSIS — M5033 Other cervical disc degeneration, cervicothoracic region: Secondary | ICD-10-CM | POA: Diagnosis not present

## 2017-07-03 DIAGNOSIS — M5033 Other cervical disc degeneration, cervicothoracic region: Secondary | ICD-10-CM | POA: Diagnosis not present

## 2017-07-03 DIAGNOSIS — M9901 Segmental and somatic dysfunction of cervical region: Secondary | ICD-10-CM | POA: Diagnosis not present

## 2017-07-24 DIAGNOSIS — M5033 Other cervical disc degeneration, cervicothoracic region: Secondary | ICD-10-CM | POA: Diagnosis not present

## 2017-07-24 DIAGNOSIS — M9901 Segmental and somatic dysfunction of cervical region: Secondary | ICD-10-CM | POA: Diagnosis not present

## 2017-07-30 DIAGNOSIS — M9901 Segmental and somatic dysfunction of cervical region: Secondary | ICD-10-CM | POA: Diagnosis not present

## 2017-07-30 DIAGNOSIS — M5033 Other cervical disc degeneration, cervicothoracic region: Secondary | ICD-10-CM | POA: Diagnosis not present

## 2017-07-31 DIAGNOSIS — M9901 Segmental and somatic dysfunction of cervical region: Secondary | ICD-10-CM | POA: Diagnosis not present

## 2017-07-31 DIAGNOSIS — M5033 Other cervical disc degeneration, cervicothoracic region: Secondary | ICD-10-CM | POA: Diagnosis not present

## 2017-08-06 DIAGNOSIS — M9901 Segmental and somatic dysfunction of cervical region: Secondary | ICD-10-CM | POA: Diagnosis not present

## 2017-08-06 DIAGNOSIS — M5033 Other cervical disc degeneration, cervicothoracic region: Secondary | ICD-10-CM | POA: Diagnosis not present

## 2017-08-07 DIAGNOSIS — M5033 Other cervical disc degeneration, cervicothoracic region: Secondary | ICD-10-CM | POA: Diagnosis not present

## 2017-08-07 DIAGNOSIS — M9901 Segmental and somatic dysfunction of cervical region: Secondary | ICD-10-CM | POA: Diagnosis not present

## 2017-08-21 DIAGNOSIS — M9901 Segmental and somatic dysfunction of cervical region: Secondary | ICD-10-CM | POA: Diagnosis not present

## 2017-08-21 DIAGNOSIS — M5033 Other cervical disc degeneration, cervicothoracic region: Secondary | ICD-10-CM | POA: Diagnosis not present

## 2017-09-04 DIAGNOSIS — M5033 Other cervical disc degeneration, cervicothoracic region: Secondary | ICD-10-CM | POA: Diagnosis not present

## 2017-09-04 DIAGNOSIS — M9901 Segmental and somatic dysfunction of cervical region: Secondary | ICD-10-CM | POA: Diagnosis not present

## 2017-09-18 DIAGNOSIS — M9901 Segmental and somatic dysfunction of cervical region: Secondary | ICD-10-CM | POA: Diagnosis not present

## 2017-09-18 DIAGNOSIS — M5033 Other cervical disc degeneration, cervicothoracic region: Secondary | ICD-10-CM | POA: Diagnosis not present

## 2017-10-03 DIAGNOSIS — M9901 Segmental and somatic dysfunction of cervical region: Secondary | ICD-10-CM | POA: Diagnosis not present

## 2017-10-03 DIAGNOSIS — M5033 Other cervical disc degeneration, cervicothoracic region: Secondary | ICD-10-CM | POA: Diagnosis not present

## 2017-10-16 DIAGNOSIS — M9901 Segmental and somatic dysfunction of cervical region: Secondary | ICD-10-CM | POA: Diagnosis not present

## 2017-10-16 DIAGNOSIS — M5033 Other cervical disc degeneration, cervicothoracic region: Secondary | ICD-10-CM | POA: Diagnosis not present

## 2017-10-30 DIAGNOSIS — M5033 Other cervical disc degeneration, cervicothoracic region: Secondary | ICD-10-CM | POA: Diagnosis not present

## 2017-10-30 DIAGNOSIS — M9901 Segmental and somatic dysfunction of cervical region: Secondary | ICD-10-CM | POA: Diagnosis not present

## 2017-11-13 DIAGNOSIS — M9901 Segmental and somatic dysfunction of cervical region: Secondary | ICD-10-CM | POA: Diagnosis not present

## 2017-11-13 DIAGNOSIS — M5033 Other cervical disc degeneration, cervicothoracic region: Secondary | ICD-10-CM | POA: Diagnosis not present

## 2017-11-27 DIAGNOSIS — M9901 Segmental and somatic dysfunction of cervical region: Secondary | ICD-10-CM | POA: Diagnosis not present

## 2017-11-27 DIAGNOSIS — M5033 Other cervical disc degeneration, cervicothoracic region: Secondary | ICD-10-CM | POA: Diagnosis not present

## 2017-12-11 DIAGNOSIS — M9901 Segmental and somatic dysfunction of cervical region: Secondary | ICD-10-CM | POA: Diagnosis not present

## 2017-12-11 DIAGNOSIS — M5033 Other cervical disc degeneration, cervicothoracic region: Secondary | ICD-10-CM | POA: Diagnosis not present

## 2018-02-19 DIAGNOSIS — M9901 Segmental and somatic dysfunction of cervical region: Secondary | ICD-10-CM | POA: Diagnosis not present

## 2018-02-19 DIAGNOSIS — M5033 Other cervical disc degeneration, cervicothoracic region: Secondary | ICD-10-CM | POA: Diagnosis not present

## 2018-03-13 DIAGNOSIS — M5033 Other cervical disc degeneration, cervicothoracic region: Secondary | ICD-10-CM | POA: Diagnosis not present

## 2018-03-13 DIAGNOSIS — M9901 Segmental and somatic dysfunction of cervical region: Secondary | ICD-10-CM | POA: Diagnosis not present

## 2018-03-19 DIAGNOSIS — M5033 Other cervical disc degeneration, cervicothoracic region: Secondary | ICD-10-CM | POA: Diagnosis not present

## 2018-03-19 DIAGNOSIS — M9901 Segmental and somatic dysfunction of cervical region: Secondary | ICD-10-CM | POA: Diagnosis not present

## 2018-04-02 DIAGNOSIS — M9901 Segmental and somatic dysfunction of cervical region: Secondary | ICD-10-CM | POA: Diagnosis not present

## 2018-04-02 DIAGNOSIS — M5033 Other cervical disc degeneration, cervicothoracic region: Secondary | ICD-10-CM | POA: Diagnosis not present

## 2018-04-16 DIAGNOSIS — M9901 Segmental and somatic dysfunction of cervical region: Secondary | ICD-10-CM | POA: Diagnosis not present

## 2018-04-16 DIAGNOSIS — M5033 Other cervical disc degeneration, cervicothoracic region: Secondary | ICD-10-CM | POA: Diagnosis not present

## 2018-04-30 DIAGNOSIS — M9901 Segmental and somatic dysfunction of cervical region: Secondary | ICD-10-CM | POA: Diagnosis not present

## 2018-04-30 DIAGNOSIS — M5033 Other cervical disc degeneration, cervicothoracic region: Secondary | ICD-10-CM | POA: Diagnosis not present

## 2018-05-14 DIAGNOSIS — M9901 Segmental and somatic dysfunction of cervical region: Secondary | ICD-10-CM | POA: Diagnosis not present

## 2018-05-14 DIAGNOSIS — M5033 Other cervical disc degeneration, cervicothoracic region: Secondary | ICD-10-CM | POA: Diagnosis not present

## 2018-05-29 DIAGNOSIS — M5033 Other cervical disc degeneration, cervicothoracic region: Secondary | ICD-10-CM | POA: Diagnosis not present

## 2018-05-29 DIAGNOSIS — M9901 Segmental and somatic dysfunction of cervical region: Secondary | ICD-10-CM | POA: Diagnosis not present

## 2018-06-11 DIAGNOSIS — M9901 Segmental and somatic dysfunction of cervical region: Secondary | ICD-10-CM | POA: Diagnosis not present

## 2018-06-11 DIAGNOSIS — M5033 Other cervical disc degeneration, cervicothoracic region: Secondary | ICD-10-CM | POA: Diagnosis not present

## 2018-06-25 DIAGNOSIS — M9901 Segmental and somatic dysfunction of cervical region: Secondary | ICD-10-CM | POA: Diagnosis not present

## 2018-06-25 DIAGNOSIS — M5033 Other cervical disc degeneration, cervicothoracic region: Secondary | ICD-10-CM | POA: Diagnosis not present

## 2018-07-16 DIAGNOSIS — M5033 Other cervical disc degeneration, cervicothoracic region: Secondary | ICD-10-CM | POA: Diagnosis not present

## 2018-07-16 DIAGNOSIS — M9901 Segmental and somatic dysfunction of cervical region: Secondary | ICD-10-CM | POA: Diagnosis not present

## 2018-07-23 DIAGNOSIS — M5033 Other cervical disc degeneration, cervicothoracic region: Secondary | ICD-10-CM | POA: Diagnosis not present

## 2018-07-23 DIAGNOSIS — M9901 Segmental and somatic dysfunction of cervical region: Secondary | ICD-10-CM | POA: Diagnosis not present

## 2018-08-06 DIAGNOSIS — M9901 Segmental and somatic dysfunction of cervical region: Secondary | ICD-10-CM | POA: Diagnosis not present

## 2018-08-06 DIAGNOSIS — M5033 Other cervical disc degeneration, cervicothoracic region: Secondary | ICD-10-CM | POA: Diagnosis not present

## 2018-08-20 DIAGNOSIS — M5033 Other cervical disc degeneration, cervicothoracic region: Secondary | ICD-10-CM | POA: Diagnosis not present

## 2018-08-20 DIAGNOSIS — M9901 Segmental and somatic dysfunction of cervical region: Secondary | ICD-10-CM | POA: Diagnosis not present

## 2018-09-02 DIAGNOSIS — M9901 Segmental and somatic dysfunction of cervical region: Secondary | ICD-10-CM | POA: Diagnosis not present

## 2018-09-02 DIAGNOSIS — M5033 Other cervical disc degeneration, cervicothoracic region: Secondary | ICD-10-CM | POA: Diagnosis not present

## 2018-09-17 DIAGNOSIS — M5033 Other cervical disc degeneration, cervicothoracic region: Secondary | ICD-10-CM | POA: Diagnosis not present

## 2018-09-17 DIAGNOSIS — M9901 Segmental and somatic dysfunction of cervical region: Secondary | ICD-10-CM | POA: Diagnosis not present

## 2018-09-24 DIAGNOSIS — M9901 Segmental and somatic dysfunction of cervical region: Secondary | ICD-10-CM | POA: Diagnosis not present

## 2018-09-24 DIAGNOSIS — M5033 Other cervical disc degeneration, cervicothoracic region: Secondary | ICD-10-CM | POA: Diagnosis not present

## 2018-10-15 DIAGNOSIS — M9901 Segmental and somatic dysfunction of cervical region: Secondary | ICD-10-CM | POA: Diagnosis not present

## 2018-10-15 DIAGNOSIS — M5033 Other cervical disc degeneration, cervicothoracic region: Secondary | ICD-10-CM | POA: Diagnosis not present

## 2018-11-12 DIAGNOSIS — M9901 Segmental and somatic dysfunction of cervical region: Secondary | ICD-10-CM | POA: Diagnosis not present

## 2018-11-12 DIAGNOSIS — M5033 Other cervical disc degeneration, cervicothoracic region: Secondary | ICD-10-CM | POA: Diagnosis not present

## 2018-11-26 DIAGNOSIS — M5033 Other cervical disc degeneration, cervicothoracic region: Secondary | ICD-10-CM | POA: Diagnosis not present

## 2018-11-26 DIAGNOSIS — M9901 Segmental and somatic dysfunction of cervical region: Secondary | ICD-10-CM | POA: Diagnosis not present

## 2018-12-10 DIAGNOSIS — M5033 Other cervical disc degeneration, cervicothoracic region: Secondary | ICD-10-CM | POA: Diagnosis not present

## 2018-12-10 DIAGNOSIS — M9901 Segmental and somatic dysfunction of cervical region: Secondary | ICD-10-CM | POA: Diagnosis not present

## 2018-12-24 DIAGNOSIS — M9901 Segmental and somatic dysfunction of cervical region: Secondary | ICD-10-CM | POA: Diagnosis not present

## 2018-12-24 DIAGNOSIS — M5033 Other cervical disc degeneration, cervicothoracic region: Secondary | ICD-10-CM | POA: Diagnosis not present

## 2019-01-07 DIAGNOSIS — M9901 Segmental and somatic dysfunction of cervical region: Secondary | ICD-10-CM | POA: Diagnosis not present

## 2019-01-07 DIAGNOSIS — M5033 Other cervical disc degeneration, cervicothoracic region: Secondary | ICD-10-CM | POA: Diagnosis not present

## 2019-01-21 DIAGNOSIS — M9901 Segmental and somatic dysfunction of cervical region: Secondary | ICD-10-CM | POA: Diagnosis not present

## 2019-01-21 DIAGNOSIS — M5033 Other cervical disc degeneration, cervicothoracic region: Secondary | ICD-10-CM | POA: Diagnosis not present

## 2019-02-04 DIAGNOSIS — M9901 Segmental and somatic dysfunction of cervical region: Secondary | ICD-10-CM | POA: Diagnosis not present

## 2019-02-04 DIAGNOSIS — M5033 Other cervical disc degeneration, cervicothoracic region: Secondary | ICD-10-CM | POA: Diagnosis not present

## 2019-02-18 DIAGNOSIS — M5033 Other cervical disc degeneration, cervicothoracic region: Secondary | ICD-10-CM | POA: Diagnosis not present

## 2019-02-18 DIAGNOSIS — M9901 Segmental and somatic dysfunction of cervical region: Secondary | ICD-10-CM | POA: Diagnosis not present

## 2019-03-04 DIAGNOSIS — M9901 Segmental and somatic dysfunction of cervical region: Secondary | ICD-10-CM | POA: Diagnosis not present

## 2019-03-04 DIAGNOSIS — M5033 Other cervical disc degeneration, cervicothoracic region: Secondary | ICD-10-CM | POA: Diagnosis not present

## 2019-03-18 DIAGNOSIS — M9901 Segmental and somatic dysfunction of cervical region: Secondary | ICD-10-CM | POA: Diagnosis not present

## 2019-03-18 DIAGNOSIS — M5033 Other cervical disc degeneration, cervicothoracic region: Secondary | ICD-10-CM | POA: Diagnosis not present

## 2019-04-01 DIAGNOSIS — M9901 Segmental and somatic dysfunction of cervical region: Secondary | ICD-10-CM | POA: Diagnosis not present

## 2019-04-01 DIAGNOSIS — M5033 Other cervical disc degeneration, cervicothoracic region: Secondary | ICD-10-CM | POA: Diagnosis not present

## 2019-04-13 DIAGNOSIS — M9901 Segmental and somatic dysfunction of cervical region: Secondary | ICD-10-CM | POA: Diagnosis not present

## 2019-04-13 DIAGNOSIS — M5033 Other cervical disc degeneration, cervicothoracic region: Secondary | ICD-10-CM | POA: Diagnosis not present

## 2019-04-16 DIAGNOSIS — M5033 Other cervical disc degeneration, cervicothoracic region: Secondary | ICD-10-CM | POA: Diagnosis not present

## 2019-04-16 DIAGNOSIS — M9901 Segmental and somatic dysfunction of cervical region: Secondary | ICD-10-CM | POA: Diagnosis not present

## 2019-04-28 DIAGNOSIS — M5033 Other cervical disc degeneration, cervicothoracic region: Secondary | ICD-10-CM | POA: Diagnosis not present

## 2019-04-28 DIAGNOSIS — M9901 Segmental and somatic dysfunction of cervical region: Secondary | ICD-10-CM | POA: Diagnosis not present

## 2019-05-13 DIAGNOSIS — M9901 Segmental and somatic dysfunction of cervical region: Secondary | ICD-10-CM | POA: Diagnosis not present

## 2019-05-13 DIAGNOSIS — M5033 Other cervical disc degeneration, cervicothoracic region: Secondary | ICD-10-CM | POA: Diagnosis not present

## 2019-05-27 DIAGNOSIS — M9901 Segmental and somatic dysfunction of cervical region: Secondary | ICD-10-CM | POA: Diagnosis not present

## 2019-05-27 DIAGNOSIS — M5033 Other cervical disc degeneration, cervicothoracic region: Secondary | ICD-10-CM | POA: Diagnosis not present

## 2019-06-09 DIAGNOSIS — M5033 Other cervical disc degeneration, cervicothoracic region: Secondary | ICD-10-CM | POA: Diagnosis not present

## 2019-06-09 DIAGNOSIS — M9901 Segmental and somatic dysfunction of cervical region: Secondary | ICD-10-CM | POA: Diagnosis not present

## 2019-06-24 DIAGNOSIS — M9901 Segmental and somatic dysfunction of cervical region: Secondary | ICD-10-CM | POA: Diagnosis not present

## 2019-06-24 DIAGNOSIS — M5033 Other cervical disc degeneration, cervicothoracic region: Secondary | ICD-10-CM | POA: Diagnosis not present

## 2019-07-08 DIAGNOSIS — M5033 Other cervical disc degeneration, cervicothoracic region: Secondary | ICD-10-CM | POA: Diagnosis not present

## 2019-07-08 DIAGNOSIS — M9901 Segmental and somatic dysfunction of cervical region: Secondary | ICD-10-CM | POA: Diagnosis not present

## 2019-07-21 DIAGNOSIS — M5033 Other cervical disc degeneration, cervicothoracic region: Secondary | ICD-10-CM | POA: Diagnosis not present

## 2019-07-21 DIAGNOSIS — M9901 Segmental and somatic dysfunction of cervical region: Secondary | ICD-10-CM | POA: Diagnosis not present

## 2019-08-05 DIAGNOSIS — M9901 Segmental and somatic dysfunction of cervical region: Secondary | ICD-10-CM | POA: Diagnosis not present

## 2019-08-05 DIAGNOSIS — M5033 Other cervical disc degeneration, cervicothoracic region: Secondary | ICD-10-CM | POA: Diagnosis not present

## 2019-08-19 DIAGNOSIS — M5033 Other cervical disc degeneration, cervicothoracic region: Secondary | ICD-10-CM | POA: Diagnosis not present

## 2019-08-19 DIAGNOSIS — M9901 Segmental and somatic dysfunction of cervical region: Secondary | ICD-10-CM | POA: Diagnosis not present

## 2019-09-01 DIAGNOSIS — M5033 Other cervical disc degeneration, cervicothoracic region: Secondary | ICD-10-CM | POA: Diagnosis not present

## 2019-09-01 DIAGNOSIS — M9901 Segmental and somatic dysfunction of cervical region: Secondary | ICD-10-CM | POA: Diagnosis not present

## 2019-09-16 DIAGNOSIS — M5033 Other cervical disc degeneration, cervicothoracic region: Secondary | ICD-10-CM | POA: Diagnosis not present

## 2019-09-16 DIAGNOSIS — M9901 Segmental and somatic dysfunction of cervical region: Secondary | ICD-10-CM | POA: Diagnosis not present

## 2019-10-07 DIAGNOSIS — M5033 Other cervical disc degeneration, cervicothoracic region: Secondary | ICD-10-CM | POA: Diagnosis not present

## 2019-10-07 DIAGNOSIS — M9901 Segmental and somatic dysfunction of cervical region: Secondary | ICD-10-CM | POA: Diagnosis not present

## 2019-10-14 DIAGNOSIS — M5033 Other cervical disc degeneration, cervicothoracic region: Secondary | ICD-10-CM | POA: Diagnosis not present

## 2019-10-14 DIAGNOSIS — M9901 Segmental and somatic dysfunction of cervical region: Secondary | ICD-10-CM | POA: Diagnosis not present

## 2019-10-28 DIAGNOSIS — M5033 Other cervical disc degeneration, cervicothoracic region: Secondary | ICD-10-CM | POA: Diagnosis not present

## 2019-10-28 DIAGNOSIS — M9901 Segmental and somatic dysfunction of cervical region: Secondary | ICD-10-CM | POA: Diagnosis not present

## 2019-11-11 DIAGNOSIS — M5033 Other cervical disc degeneration, cervicothoracic region: Secondary | ICD-10-CM | POA: Diagnosis not present

## 2019-11-11 DIAGNOSIS — M9901 Segmental and somatic dysfunction of cervical region: Secondary | ICD-10-CM | POA: Diagnosis not present

## 2019-11-13 DIAGNOSIS — E663 Overweight: Secondary | ICD-10-CM | POA: Diagnosis not present

## 2019-11-13 DIAGNOSIS — N926 Irregular menstruation, unspecified: Secondary | ICD-10-CM | POA: Diagnosis not present

## 2019-11-25 DIAGNOSIS — M9901 Segmental and somatic dysfunction of cervical region: Secondary | ICD-10-CM | POA: Diagnosis not present

## 2019-11-25 DIAGNOSIS — M5033 Other cervical disc degeneration, cervicothoracic region: Secondary | ICD-10-CM | POA: Diagnosis not present

## 2019-12-09 DIAGNOSIS — M9901 Segmental and somatic dysfunction of cervical region: Secondary | ICD-10-CM | POA: Diagnosis not present

## 2019-12-09 DIAGNOSIS — M5033 Other cervical disc degeneration, cervicothoracic region: Secondary | ICD-10-CM | POA: Diagnosis not present

## 2019-12-14 ENCOUNTER — Ambulatory Visit: Payer: BC Managed Care – PPO | Attending: Internal Medicine

## 2019-12-14 ENCOUNTER — Other Ambulatory Visit: Payer: Self-pay

## 2019-12-14 DIAGNOSIS — Z20822 Contact with and (suspected) exposure to covid-19: Secondary | ICD-10-CM

## 2019-12-15 LAB — NOVEL CORONAVIRUS, NAA: SARS-CoV-2, NAA: NOT DETECTED

## 2019-12-23 DIAGNOSIS — M9901 Segmental and somatic dysfunction of cervical region: Secondary | ICD-10-CM | POA: Diagnosis not present

## 2019-12-23 DIAGNOSIS — M5033 Other cervical disc degeneration, cervicothoracic region: Secondary | ICD-10-CM | POA: Diagnosis not present

## 2020-01-20 DIAGNOSIS — M5033 Other cervical disc degeneration, cervicothoracic region: Secondary | ICD-10-CM | POA: Diagnosis not present

## 2020-01-20 DIAGNOSIS — M9901 Segmental and somatic dysfunction of cervical region: Secondary | ICD-10-CM | POA: Diagnosis not present

## 2020-02-03 DIAGNOSIS — M5033 Other cervical disc degeneration, cervicothoracic region: Secondary | ICD-10-CM | POA: Diagnosis not present

## 2020-02-03 DIAGNOSIS — M9901 Segmental and somatic dysfunction of cervical region: Secondary | ICD-10-CM | POA: Diagnosis not present

## 2020-03-02 DIAGNOSIS — M9901 Segmental and somatic dysfunction of cervical region: Secondary | ICD-10-CM | POA: Diagnosis not present

## 2020-03-02 DIAGNOSIS — M5033 Other cervical disc degeneration, cervicothoracic region: Secondary | ICD-10-CM | POA: Diagnosis not present

## 2020-03-16 DIAGNOSIS — M9901 Segmental and somatic dysfunction of cervical region: Secondary | ICD-10-CM | POA: Diagnosis not present

## 2020-03-16 DIAGNOSIS — M5033 Other cervical disc degeneration, cervicothoracic region: Secondary | ICD-10-CM | POA: Diagnosis not present

## 2020-03-30 DIAGNOSIS — M9901 Segmental and somatic dysfunction of cervical region: Secondary | ICD-10-CM | POA: Diagnosis not present

## 2020-03-30 DIAGNOSIS — M5033 Other cervical disc degeneration, cervicothoracic region: Secondary | ICD-10-CM | POA: Diagnosis not present

## 2020-04-13 DIAGNOSIS — M5033 Other cervical disc degeneration, cervicothoracic region: Secondary | ICD-10-CM | POA: Diagnosis not present

## 2020-04-13 DIAGNOSIS — M9901 Segmental and somatic dysfunction of cervical region: Secondary | ICD-10-CM | POA: Diagnosis not present

## 2020-04-20 DIAGNOSIS — M9901 Segmental and somatic dysfunction of cervical region: Secondary | ICD-10-CM | POA: Diagnosis not present

## 2020-04-20 DIAGNOSIS — M5033 Other cervical disc degeneration, cervicothoracic region: Secondary | ICD-10-CM | POA: Diagnosis not present

## 2020-05-11 DIAGNOSIS — M5033 Other cervical disc degeneration, cervicothoracic region: Secondary | ICD-10-CM | POA: Diagnosis not present

## 2020-05-11 DIAGNOSIS — M9901 Segmental and somatic dysfunction of cervical region: Secondary | ICD-10-CM | POA: Diagnosis not present

## 2020-05-12 DIAGNOSIS — M9901 Segmental and somatic dysfunction of cervical region: Secondary | ICD-10-CM | POA: Diagnosis not present

## 2020-05-12 DIAGNOSIS — M5033 Other cervical disc degeneration, cervicothoracic region: Secondary | ICD-10-CM | POA: Diagnosis not present

## 2020-05-24 DIAGNOSIS — M9901 Segmental and somatic dysfunction of cervical region: Secondary | ICD-10-CM | POA: Diagnosis not present

## 2020-05-24 DIAGNOSIS — M5033 Other cervical disc degeneration, cervicothoracic region: Secondary | ICD-10-CM | POA: Diagnosis not present

## 2020-06-08 DIAGNOSIS — M9901 Segmental and somatic dysfunction of cervical region: Secondary | ICD-10-CM | POA: Diagnosis not present

## 2020-06-08 DIAGNOSIS — M5033 Other cervical disc degeneration, cervicothoracic region: Secondary | ICD-10-CM | POA: Diagnosis not present

## 2020-06-22 DIAGNOSIS — M5033 Other cervical disc degeneration, cervicothoracic region: Secondary | ICD-10-CM | POA: Diagnosis not present

## 2020-06-22 DIAGNOSIS — M9901 Segmental and somatic dysfunction of cervical region: Secondary | ICD-10-CM | POA: Diagnosis not present

## 2020-07-06 DIAGNOSIS — M9901 Segmental and somatic dysfunction of cervical region: Secondary | ICD-10-CM | POA: Diagnosis not present

## 2020-07-06 DIAGNOSIS — M5033 Other cervical disc degeneration, cervicothoracic region: Secondary | ICD-10-CM | POA: Diagnosis not present

## 2020-07-20 DIAGNOSIS — M9901 Segmental and somatic dysfunction of cervical region: Secondary | ICD-10-CM | POA: Diagnosis not present

## 2020-07-20 DIAGNOSIS — M5033 Other cervical disc degeneration, cervicothoracic region: Secondary | ICD-10-CM | POA: Diagnosis not present

## 2020-08-03 DIAGNOSIS — M5033 Other cervical disc degeneration, cervicothoracic region: Secondary | ICD-10-CM | POA: Diagnosis not present

## 2020-08-03 DIAGNOSIS — M9901 Segmental and somatic dysfunction of cervical region: Secondary | ICD-10-CM | POA: Diagnosis not present

## 2020-08-17 DIAGNOSIS — M9901 Segmental and somatic dysfunction of cervical region: Secondary | ICD-10-CM | POA: Diagnosis not present

## 2020-08-17 DIAGNOSIS — M5033 Other cervical disc degeneration, cervicothoracic region: Secondary | ICD-10-CM | POA: Diagnosis not present

## 2020-08-30 DIAGNOSIS — M5033 Other cervical disc degeneration, cervicothoracic region: Secondary | ICD-10-CM | POA: Diagnosis not present

## 2020-08-30 DIAGNOSIS — M9901 Segmental and somatic dysfunction of cervical region: Secondary | ICD-10-CM | POA: Diagnosis not present

## 2020-09-14 DIAGNOSIS — M9901 Segmental and somatic dysfunction of cervical region: Secondary | ICD-10-CM | POA: Diagnosis not present

## 2020-09-14 DIAGNOSIS — M5033 Other cervical disc degeneration, cervicothoracic region: Secondary | ICD-10-CM | POA: Diagnosis not present

## 2020-09-27 DIAGNOSIS — M5033 Other cervical disc degeneration, cervicothoracic region: Secondary | ICD-10-CM | POA: Diagnosis not present

## 2020-09-27 DIAGNOSIS — M9901 Segmental and somatic dysfunction of cervical region: Secondary | ICD-10-CM | POA: Diagnosis not present

## 2020-10-12 DIAGNOSIS — U071 COVID-19: Secondary | ICD-10-CM | POA: Diagnosis not present

## 2020-10-12 DIAGNOSIS — J069 Acute upper respiratory infection, unspecified: Secondary | ICD-10-CM | POA: Diagnosis not present

## 2020-10-19 DIAGNOSIS — M9901 Segmental and somatic dysfunction of cervical region: Secondary | ICD-10-CM | POA: Diagnosis not present

## 2020-10-19 DIAGNOSIS — M5033 Other cervical disc degeneration, cervicothoracic region: Secondary | ICD-10-CM | POA: Diagnosis not present

## 2020-10-26 DIAGNOSIS — M5033 Other cervical disc degeneration, cervicothoracic region: Secondary | ICD-10-CM | POA: Diagnosis not present

## 2020-10-26 DIAGNOSIS — M9901 Segmental and somatic dysfunction of cervical region: Secondary | ICD-10-CM | POA: Diagnosis not present

## 2020-11-09 DIAGNOSIS — M5033 Other cervical disc degeneration, cervicothoracic region: Secondary | ICD-10-CM | POA: Diagnosis not present

## 2020-11-09 DIAGNOSIS — M9901 Segmental and somatic dysfunction of cervical region: Secondary | ICD-10-CM | POA: Diagnosis not present

## 2020-11-23 DIAGNOSIS — M5033 Other cervical disc degeneration, cervicothoracic region: Secondary | ICD-10-CM | POA: Diagnosis not present

## 2020-11-23 DIAGNOSIS — M9901 Segmental and somatic dysfunction of cervical region: Secondary | ICD-10-CM | POA: Diagnosis not present

## 2020-12-07 DIAGNOSIS — M9901 Segmental and somatic dysfunction of cervical region: Secondary | ICD-10-CM | POA: Diagnosis not present

## 2020-12-07 DIAGNOSIS — M5033 Other cervical disc degeneration, cervicothoracic region: Secondary | ICD-10-CM | POA: Diagnosis not present

## 2020-12-21 DIAGNOSIS — M5033 Other cervical disc degeneration, cervicothoracic region: Secondary | ICD-10-CM | POA: Diagnosis not present

## 2020-12-21 DIAGNOSIS — M9901 Segmental and somatic dysfunction of cervical region: Secondary | ICD-10-CM | POA: Diagnosis not present

## 2021-01-04 DIAGNOSIS — M9901 Segmental and somatic dysfunction of cervical region: Secondary | ICD-10-CM | POA: Diagnosis not present

## 2021-01-04 DIAGNOSIS — M5033 Other cervical disc degeneration, cervicothoracic region: Secondary | ICD-10-CM | POA: Diagnosis not present

## 2021-01-18 DIAGNOSIS — M9901 Segmental and somatic dysfunction of cervical region: Secondary | ICD-10-CM | POA: Diagnosis not present

## 2021-01-18 DIAGNOSIS — M5033 Other cervical disc degeneration, cervicothoracic region: Secondary | ICD-10-CM | POA: Diagnosis not present

## 2021-02-01 DIAGNOSIS — M9901 Segmental and somatic dysfunction of cervical region: Secondary | ICD-10-CM | POA: Diagnosis not present

## 2021-02-01 DIAGNOSIS — M5033 Other cervical disc degeneration, cervicothoracic region: Secondary | ICD-10-CM | POA: Diagnosis not present

## 2021-02-15 DIAGNOSIS — M5033 Other cervical disc degeneration, cervicothoracic region: Secondary | ICD-10-CM | POA: Diagnosis not present

## 2021-02-15 DIAGNOSIS — M9901 Segmental and somatic dysfunction of cervical region: Secondary | ICD-10-CM | POA: Diagnosis not present

## 2021-03-01 DIAGNOSIS — M5033 Other cervical disc degeneration, cervicothoracic region: Secondary | ICD-10-CM | POA: Diagnosis not present

## 2021-03-01 DIAGNOSIS — M9901 Segmental and somatic dysfunction of cervical region: Secondary | ICD-10-CM | POA: Diagnosis not present

## 2021-03-15 DIAGNOSIS — M9901 Segmental and somatic dysfunction of cervical region: Secondary | ICD-10-CM | POA: Diagnosis not present

## 2021-03-15 DIAGNOSIS — M5033 Other cervical disc degeneration, cervicothoracic region: Secondary | ICD-10-CM | POA: Diagnosis not present

## 2021-03-29 DIAGNOSIS — M5033 Other cervical disc degeneration, cervicothoracic region: Secondary | ICD-10-CM | POA: Diagnosis not present

## 2021-03-29 DIAGNOSIS — M9901 Segmental and somatic dysfunction of cervical region: Secondary | ICD-10-CM | POA: Diagnosis not present

## 2021-04-13 DIAGNOSIS — M9901 Segmental and somatic dysfunction of cervical region: Secondary | ICD-10-CM | POA: Diagnosis not present

## 2021-04-13 DIAGNOSIS — M5033 Other cervical disc degeneration, cervicothoracic region: Secondary | ICD-10-CM | POA: Diagnosis not present

## 2021-04-25 DIAGNOSIS — M5033 Other cervical disc degeneration, cervicothoracic region: Secondary | ICD-10-CM | POA: Diagnosis not present

## 2021-04-25 DIAGNOSIS — M9901 Segmental and somatic dysfunction of cervical region: Secondary | ICD-10-CM | POA: Diagnosis not present

## 2021-05-08 DIAGNOSIS — M9901 Segmental and somatic dysfunction of cervical region: Secondary | ICD-10-CM | POA: Diagnosis not present

## 2021-05-08 DIAGNOSIS — M5033 Other cervical disc degeneration, cervicothoracic region: Secondary | ICD-10-CM | POA: Diagnosis not present

## 2021-06-07 DIAGNOSIS — M9901 Segmental and somatic dysfunction of cervical region: Secondary | ICD-10-CM | POA: Diagnosis not present

## 2021-06-07 DIAGNOSIS — M5033 Other cervical disc degeneration, cervicothoracic region: Secondary | ICD-10-CM | POA: Diagnosis not present

## 2021-06-21 DIAGNOSIS — M5033 Other cervical disc degeneration, cervicothoracic region: Secondary | ICD-10-CM | POA: Diagnosis not present

## 2021-06-21 DIAGNOSIS — M9901 Segmental and somatic dysfunction of cervical region: Secondary | ICD-10-CM | POA: Diagnosis not present

## 2021-07-05 DIAGNOSIS — M9901 Segmental and somatic dysfunction of cervical region: Secondary | ICD-10-CM | POA: Diagnosis not present

## 2021-07-05 DIAGNOSIS — M5033 Other cervical disc degeneration, cervicothoracic region: Secondary | ICD-10-CM | POA: Diagnosis not present

## 2021-07-19 DIAGNOSIS — M5033 Other cervical disc degeneration, cervicothoracic region: Secondary | ICD-10-CM | POA: Diagnosis not present

## 2021-07-19 DIAGNOSIS — M9901 Segmental and somatic dysfunction of cervical region: Secondary | ICD-10-CM | POA: Diagnosis not present

## 2021-08-02 DIAGNOSIS — M5033 Other cervical disc degeneration, cervicothoracic region: Secondary | ICD-10-CM | POA: Diagnosis not present

## 2021-08-02 DIAGNOSIS — M9901 Segmental and somatic dysfunction of cervical region: Secondary | ICD-10-CM | POA: Diagnosis not present

## 2021-08-16 DIAGNOSIS — M9901 Segmental and somatic dysfunction of cervical region: Secondary | ICD-10-CM | POA: Diagnosis not present

## 2021-08-16 DIAGNOSIS — M5033 Other cervical disc degeneration, cervicothoracic region: Secondary | ICD-10-CM | POA: Diagnosis not present

## 2021-08-29 DIAGNOSIS — M9901 Segmental and somatic dysfunction of cervical region: Secondary | ICD-10-CM | POA: Diagnosis not present

## 2021-08-29 DIAGNOSIS — M5033 Other cervical disc degeneration, cervicothoracic region: Secondary | ICD-10-CM | POA: Diagnosis not present

## 2021-09-13 DIAGNOSIS — M5033 Other cervical disc degeneration, cervicothoracic region: Secondary | ICD-10-CM | POA: Diagnosis not present

## 2021-09-13 DIAGNOSIS — M9901 Segmental and somatic dysfunction of cervical region: Secondary | ICD-10-CM | POA: Diagnosis not present

## 2021-09-27 DIAGNOSIS — M5033 Other cervical disc degeneration, cervicothoracic region: Secondary | ICD-10-CM | POA: Diagnosis not present

## 2021-09-27 DIAGNOSIS — M9901 Segmental and somatic dysfunction of cervical region: Secondary | ICD-10-CM | POA: Diagnosis not present

## 2021-10-11 DIAGNOSIS — M9901 Segmental and somatic dysfunction of cervical region: Secondary | ICD-10-CM | POA: Diagnosis not present

## 2021-10-11 DIAGNOSIS — M5033 Other cervical disc degeneration, cervicothoracic region: Secondary | ICD-10-CM | POA: Diagnosis not present

## 2021-10-25 DIAGNOSIS — M5033 Other cervical disc degeneration, cervicothoracic region: Secondary | ICD-10-CM | POA: Diagnosis not present

## 2021-10-25 DIAGNOSIS — M9901 Segmental and somatic dysfunction of cervical region: Secondary | ICD-10-CM | POA: Diagnosis not present

## 2021-11-08 DIAGNOSIS — M9901 Segmental and somatic dysfunction of cervical region: Secondary | ICD-10-CM | POA: Diagnosis not present

## 2021-11-08 DIAGNOSIS — M5033 Other cervical disc degeneration, cervicothoracic region: Secondary | ICD-10-CM | POA: Diagnosis not present

## 2021-11-23 DIAGNOSIS — M5033 Other cervical disc degeneration, cervicothoracic region: Secondary | ICD-10-CM | POA: Diagnosis not present

## 2021-11-23 DIAGNOSIS — M9901 Segmental and somatic dysfunction of cervical region: Secondary | ICD-10-CM | POA: Diagnosis not present

## 2021-12-06 DIAGNOSIS — M5033 Other cervical disc degeneration, cervicothoracic region: Secondary | ICD-10-CM | POA: Diagnosis not present

## 2021-12-06 DIAGNOSIS — M9901 Segmental and somatic dysfunction of cervical region: Secondary | ICD-10-CM | POA: Diagnosis not present

## 2021-12-20 DIAGNOSIS — M5033 Other cervical disc degeneration, cervicothoracic region: Secondary | ICD-10-CM | POA: Diagnosis not present

## 2021-12-20 DIAGNOSIS — M9901 Segmental and somatic dysfunction of cervical region: Secondary | ICD-10-CM | POA: Diagnosis not present

## 2021-12-25 DIAGNOSIS — N926 Irregular menstruation, unspecified: Secondary | ICD-10-CM | POA: Diagnosis not present

## 2021-12-25 DIAGNOSIS — R229 Localized swelling, mass and lump, unspecified: Secondary | ICD-10-CM | POA: Diagnosis not present

## 2021-12-27 DIAGNOSIS — Z1231 Encounter for screening mammogram for malignant neoplasm of breast: Secondary | ICD-10-CM | POA: Diagnosis not present

## 2021-12-29 DIAGNOSIS — R928 Other abnormal and inconclusive findings on diagnostic imaging of breast: Secondary | ICD-10-CM | POA: Diagnosis not present

## 2021-12-29 DIAGNOSIS — R922 Inconclusive mammogram: Secondary | ICD-10-CM | POA: Diagnosis not present

## 2021-12-29 DIAGNOSIS — N6489 Other specified disorders of breast: Secondary | ICD-10-CM | POA: Diagnosis not present

## 2022-01-03 DIAGNOSIS — M9901 Segmental and somatic dysfunction of cervical region: Secondary | ICD-10-CM | POA: Diagnosis not present

## 2022-01-03 DIAGNOSIS — M5033 Other cervical disc degeneration, cervicothoracic region: Secondary | ICD-10-CM | POA: Diagnosis not present

## 2022-01-10 ENCOUNTER — Other Ambulatory Visit: Payer: Self-pay | Admitting: Radiology

## 2022-01-10 DIAGNOSIS — N6011 Diffuse cystic mastopathy of right breast: Secondary | ICD-10-CM | POA: Diagnosis not present

## 2022-01-10 DIAGNOSIS — R928 Other abnormal and inconclusive findings on diagnostic imaging of breast: Secondary | ICD-10-CM | POA: Diagnosis not present

## 2022-01-17 DIAGNOSIS — M9901 Segmental and somatic dysfunction of cervical region: Secondary | ICD-10-CM | POA: Diagnosis not present

## 2022-01-17 DIAGNOSIS — M5033 Other cervical disc degeneration, cervicothoracic region: Secondary | ICD-10-CM | POA: Diagnosis not present

## 2022-01-18 DIAGNOSIS — R030 Elevated blood-pressure reading, without diagnosis of hypertension: Secondary | ICD-10-CM | POA: Diagnosis not present

## 2022-01-18 DIAGNOSIS — Z124 Encounter for screening for malignant neoplasm of cervix: Secondary | ICD-10-CM | POA: Diagnosis not present

## 2022-01-18 DIAGNOSIS — Z0000 Encounter for general adult medical examination without abnormal findings: Secondary | ICD-10-CM | POA: Diagnosis not present

## 2022-01-18 DIAGNOSIS — Z13220 Encounter for screening for lipoid disorders: Secondary | ICD-10-CM | POA: Diagnosis not present

## 2022-01-31 DIAGNOSIS — M9901 Segmental and somatic dysfunction of cervical region: Secondary | ICD-10-CM | POA: Diagnosis not present

## 2022-01-31 DIAGNOSIS — M5033 Other cervical disc degeneration, cervicothoracic region: Secondary | ICD-10-CM | POA: Diagnosis not present

## 2022-02-06 DIAGNOSIS — D485 Neoplasm of uncertain behavior of skin: Secondary | ICD-10-CM | POA: Diagnosis not present

## 2022-02-07 DIAGNOSIS — R030 Elevated blood-pressure reading, without diagnosis of hypertension: Secondary | ICD-10-CM | POA: Diagnosis not present

## 2022-02-13 DIAGNOSIS — R87810 Cervical high risk human papillomavirus (HPV) DNA test positive: Secondary | ICD-10-CM | POA: Diagnosis not present

## 2022-02-14 DIAGNOSIS — M9901 Segmental and somatic dysfunction of cervical region: Secondary | ICD-10-CM | POA: Diagnosis not present

## 2022-02-14 DIAGNOSIS — M5033 Other cervical disc degeneration, cervicothoracic region: Secondary | ICD-10-CM | POA: Diagnosis not present

## 2022-02-27 DIAGNOSIS — R2231 Localized swelling, mass and lump, right upper limb: Secondary | ICD-10-CM | POA: Diagnosis not present

## 2022-02-28 DIAGNOSIS — M9901 Segmental and somatic dysfunction of cervical region: Secondary | ICD-10-CM | POA: Diagnosis not present

## 2022-02-28 DIAGNOSIS — M5033 Other cervical disc degeneration, cervicothoracic region: Secondary | ICD-10-CM | POA: Diagnosis not present

## 2022-03-01 ENCOUNTER — Other Ambulatory Visit: Payer: Self-pay | Admitting: General Surgery

## 2022-03-01 DIAGNOSIS — R2231 Localized swelling, mass and lump, right upper limb: Secondary | ICD-10-CM

## 2022-03-02 ENCOUNTER — Ambulatory Visit
Admission: RE | Admit: 2022-03-02 | Discharge: 2022-03-02 | Disposition: A | Discharge status: Home / Self Care | Payer: BC Managed Care – PPO | Source: Ambulatory Visit | Attending: General Surgery | Admitting: General Surgery

## 2022-03-02 DIAGNOSIS — R2231 Localized swelling, mass and lump, right upper limb: Secondary | ICD-10-CM

## 2022-03-14 DIAGNOSIS — M9901 Segmental and somatic dysfunction of cervical region: Secondary | ICD-10-CM | POA: Diagnosis not present

## 2022-03-14 DIAGNOSIS — M5033 Other cervical disc degeneration, cervicothoracic region: Secondary | ICD-10-CM | POA: Diagnosis not present

## 2022-03-28 DIAGNOSIS — M5033 Other cervical disc degeneration, cervicothoracic region: Secondary | ICD-10-CM | POA: Diagnosis not present

## 2022-03-28 DIAGNOSIS — M9901 Segmental and somatic dysfunction of cervical region: Secondary | ICD-10-CM | POA: Diagnosis not present

## 2022-04-04 DIAGNOSIS — M5033 Other cervical disc degeneration, cervicothoracic region: Secondary | ICD-10-CM | POA: Diagnosis not present

## 2022-04-04 DIAGNOSIS — M9901 Segmental and somatic dysfunction of cervical region: Secondary | ICD-10-CM | POA: Diagnosis not present

## 2022-04-23 DIAGNOSIS — M9901 Segmental and somatic dysfunction of cervical region: Secondary | ICD-10-CM | POA: Diagnosis not present

## 2022-04-23 DIAGNOSIS — M5033 Other cervical disc degeneration, cervicothoracic region: Secondary | ICD-10-CM | POA: Diagnosis not present

## 2022-05-09 DIAGNOSIS — M9901 Segmental and somatic dysfunction of cervical region: Secondary | ICD-10-CM | POA: Diagnosis not present

## 2022-05-09 DIAGNOSIS — M5033 Other cervical disc degeneration, cervicothoracic region: Secondary | ICD-10-CM | POA: Diagnosis not present

## 2022-05-14 ENCOUNTER — Other Ambulatory Visit: Payer: Self-pay | Admitting: General Surgery

## 2022-05-14 DIAGNOSIS — M7989 Other specified soft tissue disorders: Secondary | ICD-10-CM | POA: Diagnosis not present

## 2022-05-14 DIAGNOSIS — M61411 Other calcification of muscle, right shoulder: Secondary | ICD-10-CM | POA: Diagnosis not present

## 2022-05-14 DIAGNOSIS — L929 Granulomatous disorder of the skin and subcutaneous tissue, unspecified: Secondary | ICD-10-CM | POA: Diagnosis not present

## 2022-05-14 DIAGNOSIS — R2231 Localized swelling, mass and lump, right upper limb: Secondary | ICD-10-CM | POA: Diagnosis not present

## 2022-05-23 DIAGNOSIS — M5033 Other cervical disc degeneration, cervicothoracic region: Secondary | ICD-10-CM | POA: Diagnosis not present

## 2022-05-23 DIAGNOSIS — M9901 Segmental and somatic dysfunction of cervical region: Secondary | ICD-10-CM | POA: Diagnosis not present

## 2022-06-06 DIAGNOSIS — M9901 Segmental and somatic dysfunction of cervical region: Secondary | ICD-10-CM | POA: Diagnosis not present

## 2022-06-06 DIAGNOSIS — M5033 Other cervical disc degeneration, cervicothoracic region: Secondary | ICD-10-CM | POA: Diagnosis not present

## 2022-06-20 DIAGNOSIS — M9901 Segmental and somatic dysfunction of cervical region: Secondary | ICD-10-CM | POA: Diagnosis not present

## 2022-06-20 DIAGNOSIS — M5033 Other cervical disc degeneration, cervicothoracic region: Secondary | ICD-10-CM | POA: Diagnosis not present

## 2022-07-17 DIAGNOSIS — M9901 Segmental and somatic dysfunction of cervical region: Secondary | ICD-10-CM | POA: Diagnosis not present

## 2022-07-17 DIAGNOSIS — M5033 Other cervical disc degeneration, cervicothoracic region: Secondary | ICD-10-CM | POA: Diagnosis not present

## 2022-08-01 DIAGNOSIS — M9901 Segmental and somatic dysfunction of cervical region: Secondary | ICD-10-CM | POA: Diagnosis not present

## 2022-08-01 DIAGNOSIS — M5033 Other cervical disc degeneration, cervicothoracic region: Secondary | ICD-10-CM | POA: Diagnosis not present

## 2022-08-12 IMAGING — US US EXTREM UP *R* LTD
1 series · 14 of 23 positions shown · non-contrast
Comparison: None Available.

CLINICAL DATA: Mass of skin of right shoulder. SOFT TISSUE MASS
RIGHT SHOULDER

EXAM:
ULTRASOUND RIGHT UPPER EXTREMITY LIMITED
TECHNIQUE: Ultrasound examination of the upper extremity soft tissues was
performed in the area of clinical concern.

[Series 1: us extrem up *right* ltd · 0.06mm/px · 23 acquisitions, 14 frames shown]
[im 1/23]
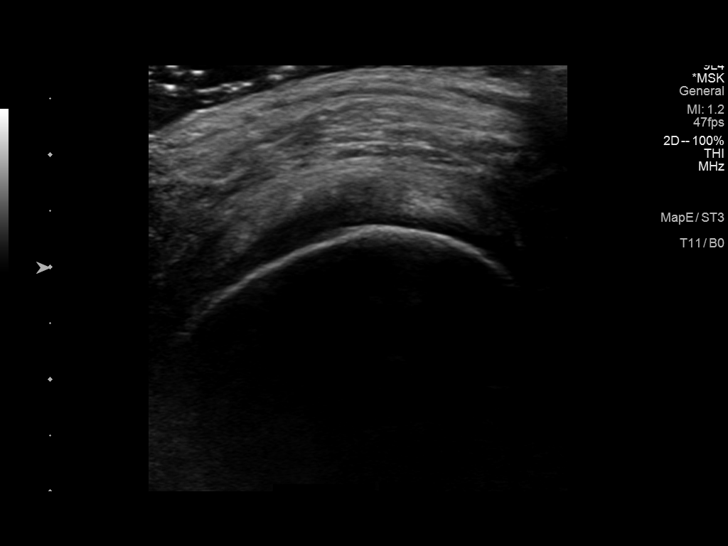
[im 3/23]
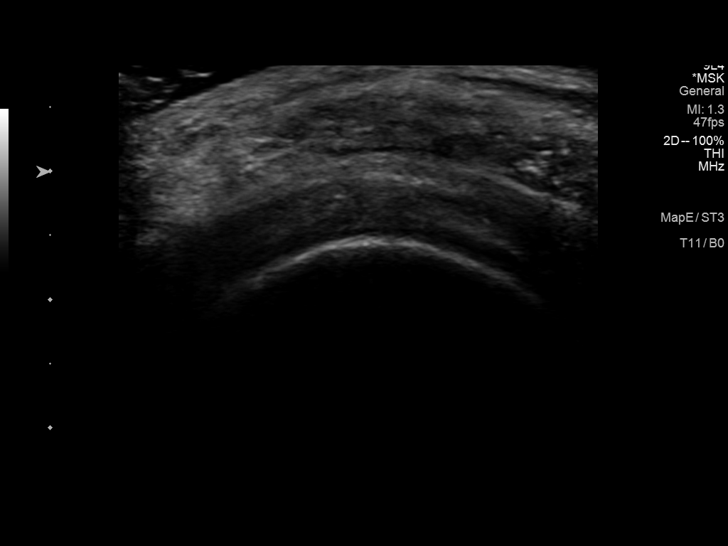
[im 5/23]
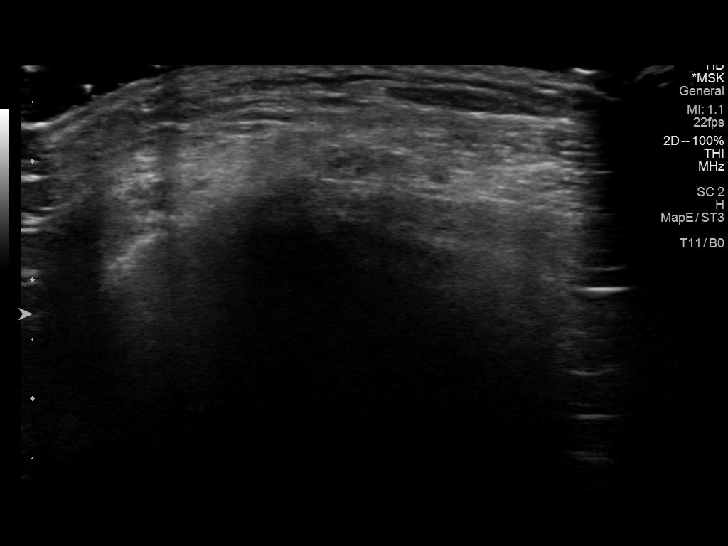
[im 6/23]
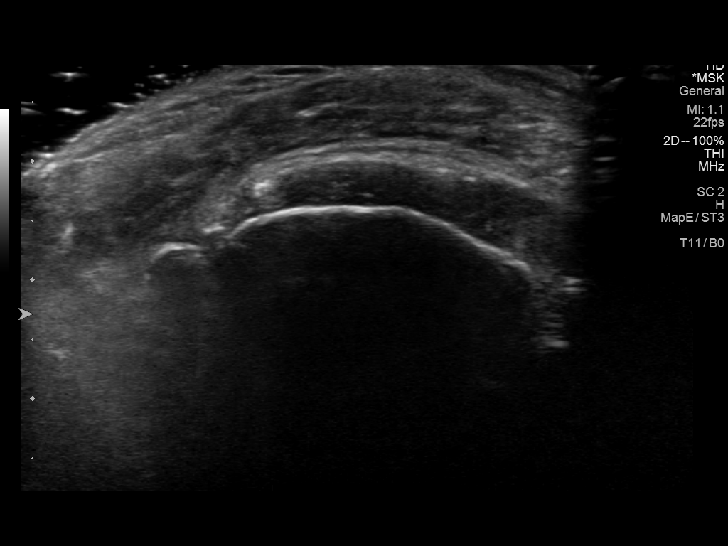
[im 8/23]
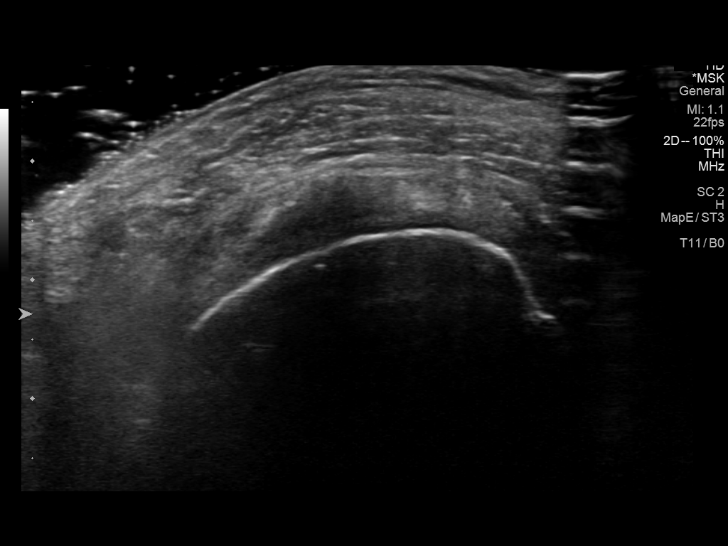
[im 10/23]
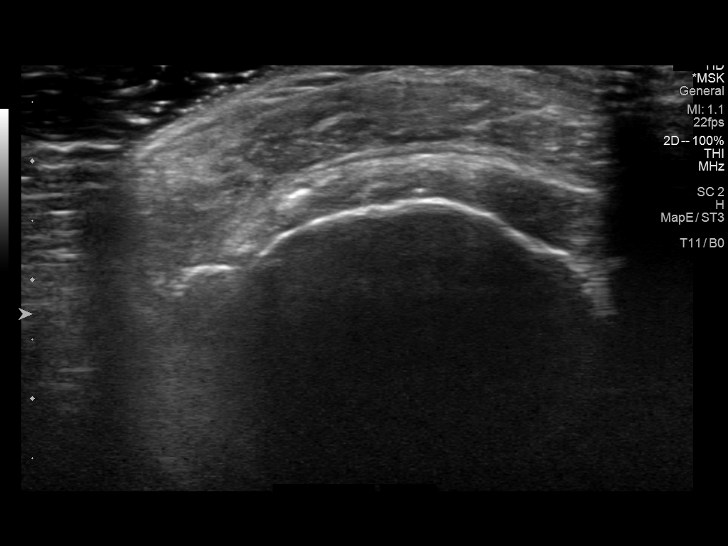
[im 11/23]
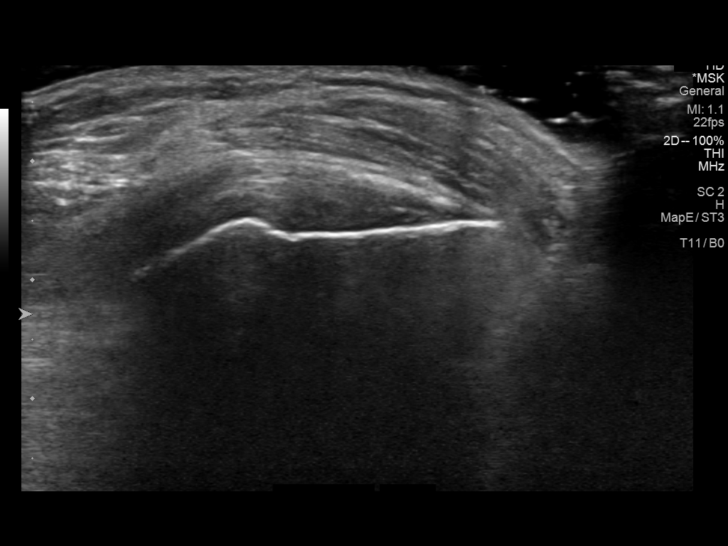
[im 13/23]
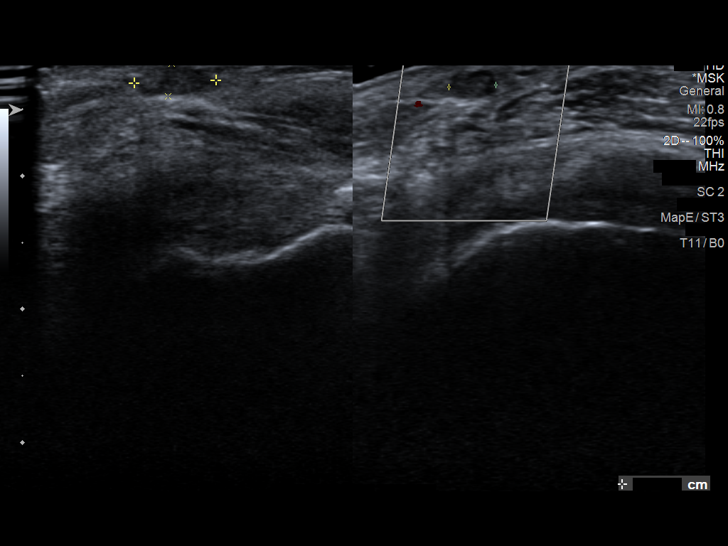
[im 14/23]
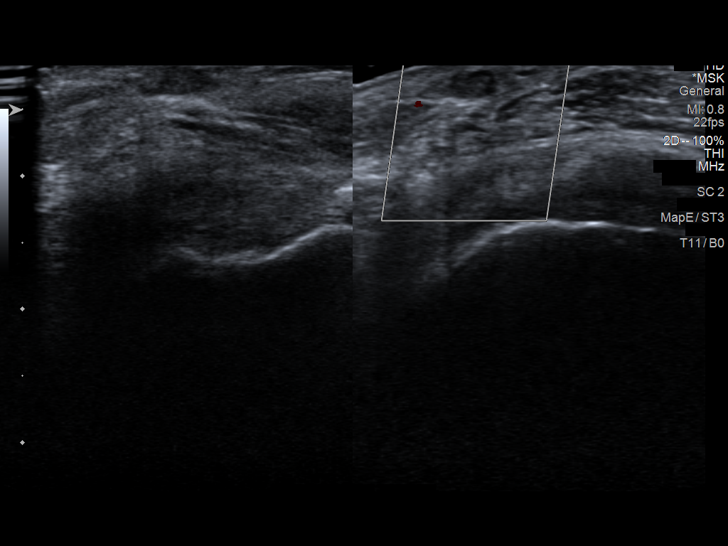
[im 16/23]
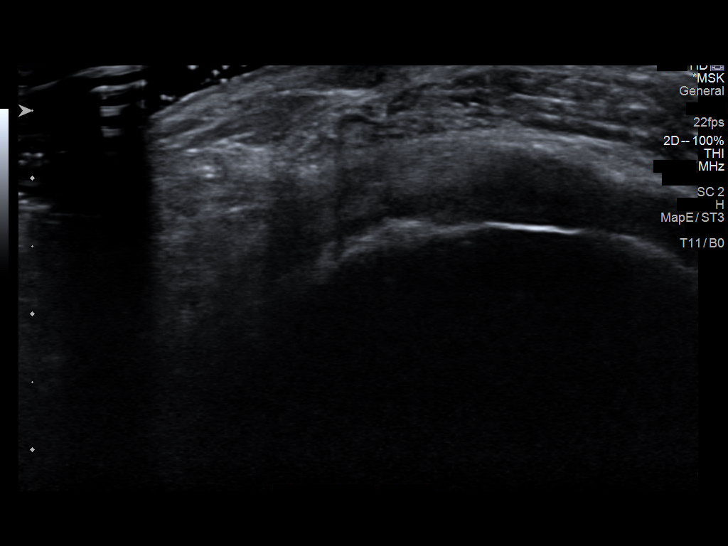
[im 18/23]
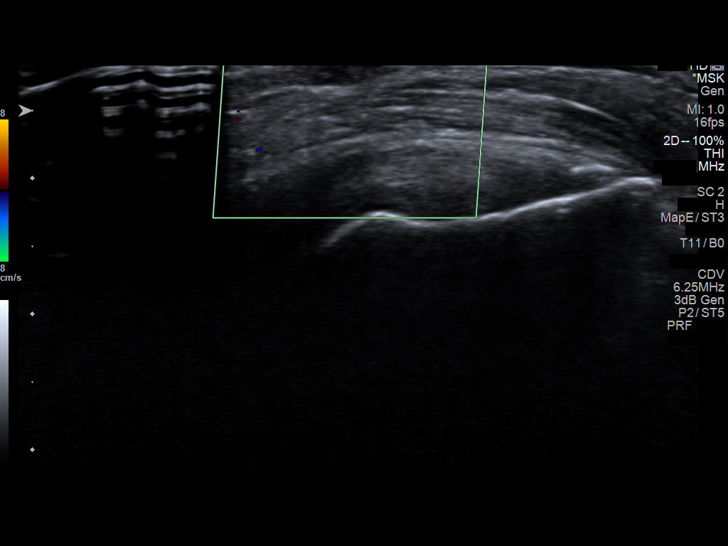
[im 19/23]
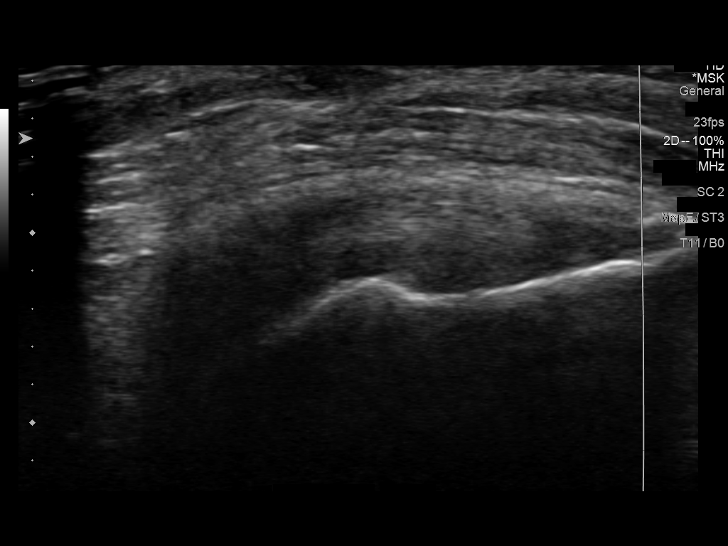
[im 21/23]
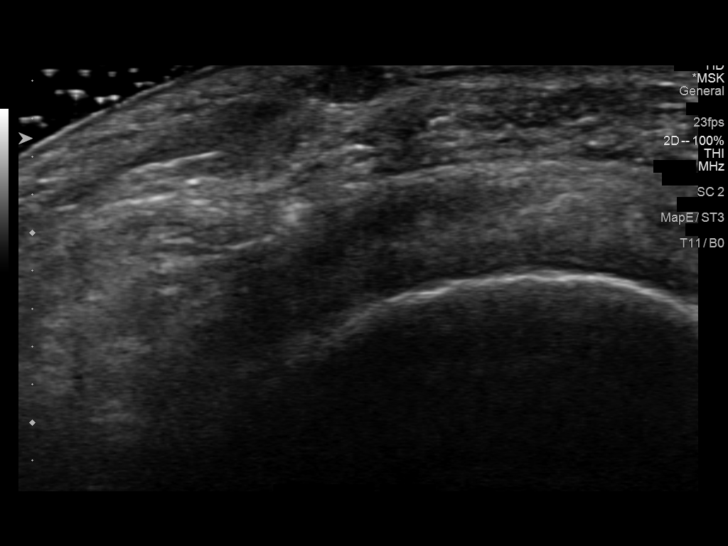
[im 23/23]
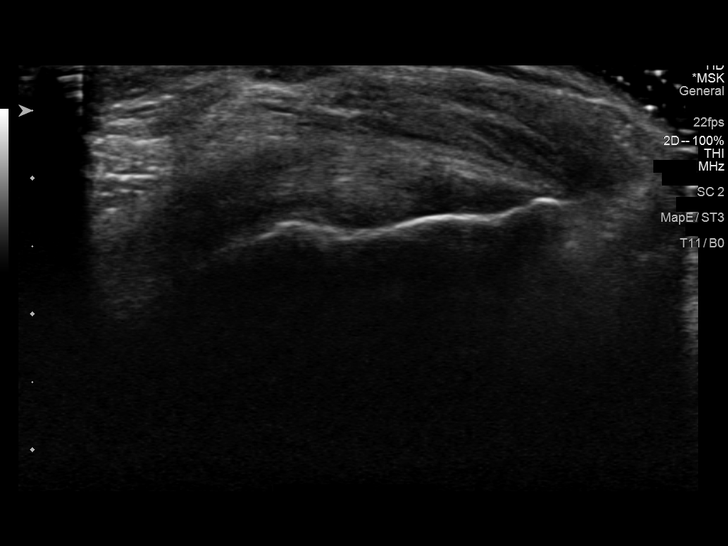

[14 of 23 positions shown; findings below may reference images not displayed]

FINDINGS: Corresponding to the palpable area of concern is a subcutaneous,
hypoechoic mass measuring 0.6 x 0.3 x 0.4 cm, with internal debris.
There is no significant internal vascularity. There is no visible
tract to the skin.
IMPRESSION: Hypoechoic subcutaneous mass measuring 0.6 x 0.3 x 0.4 cm
corresponding to the palpable area of concern along the right
shoulder. There is favored to be a small sebaceous cyst with
internal debris. Recommend clinical follow-up.

## 2022-08-13 DIAGNOSIS — N946 Dysmenorrhea, unspecified: Secondary | ICD-10-CM | POA: Diagnosis not present

## 2022-08-13 DIAGNOSIS — N926 Irregular menstruation, unspecified: Secondary | ICD-10-CM | POA: Diagnosis not present

## 2022-08-13 DIAGNOSIS — T753XXA Motion sickness, initial encounter: Secondary | ICD-10-CM | POA: Diagnosis not present

## 2022-08-13 DIAGNOSIS — I10 Essential (primary) hypertension: Secondary | ICD-10-CM | POA: Diagnosis not present

## 2022-08-15 DIAGNOSIS — M9901 Segmental and somatic dysfunction of cervical region: Secondary | ICD-10-CM | POA: Diagnosis not present

## 2022-08-15 DIAGNOSIS — M5033 Other cervical disc degeneration, cervicothoracic region: Secondary | ICD-10-CM | POA: Diagnosis not present

## 2022-08-29 DIAGNOSIS — M9901 Segmental and somatic dysfunction of cervical region: Secondary | ICD-10-CM | POA: Diagnosis not present

## 2022-08-29 DIAGNOSIS — M5033 Other cervical disc degeneration, cervicothoracic region: Secondary | ICD-10-CM | POA: Diagnosis not present

## 2022-09-12 DIAGNOSIS — M9901 Segmental and somatic dysfunction of cervical region: Secondary | ICD-10-CM | POA: Diagnosis not present

## 2022-09-12 DIAGNOSIS — M5033 Other cervical disc degeneration, cervicothoracic region: Secondary | ICD-10-CM | POA: Diagnosis not present

## 2022-09-26 DIAGNOSIS — M5033 Other cervical disc degeneration, cervicothoracic region: Secondary | ICD-10-CM | POA: Diagnosis not present

## 2022-09-26 DIAGNOSIS — M9901 Segmental and somatic dysfunction of cervical region: Secondary | ICD-10-CM | POA: Diagnosis not present

## 2022-10-10 DIAGNOSIS — M9901 Segmental and somatic dysfunction of cervical region: Secondary | ICD-10-CM | POA: Diagnosis not present

## 2022-10-10 DIAGNOSIS — M5033 Other cervical disc degeneration, cervicothoracic region: Secondary | ICD-10-CM | POA: Diagnosis not present

## 2022-10-24 DIAGNOSIS — M5033 Other cervical disc degeneration, cervicothoracic region: Secondary | ICD-10-CM | POA: Diagnosis not present

## 2022-10-24 DIAGNOSIS — M9901 Segmental and somatic dysfunction of cervical region: Secondary | ICD-10-CM | POA: Diagnosis not present

## 2022-11-07 DIAGNOSIS — M5033 Other cervical disc degeneration, cervicothoracic region: Secondary | ICD-10-CM | POA: Diagnosis not present

## 2022-11-07 DIAGNOSIS — M9901 Segmental and somatic dysfunction of cervical region: Secondary | ICD-10-CM | POA: Diagnosis not present

## 2022-11-21 DIAGNOSIS — M5033 Other cervical disc degeneration, cervicothoracic region: Secondary | ICD-10-CM | POA: Diagnosis not present

## 2022-11-21 DIAGNOSIS — M9901 Segmental and somatic dysfunction of cervical region: Secondary | ICD-10-CM | POA: Diagnosis not present

## 2022-12-05 DIAGNOSIS — M5033 Other cervical disc degeneration, cervicothoracic region: Secondary | ICD-10-CM | POA: Diagnosis not present

## 2022-12-05 DIAGNOSIS — M9901 Segmental and somatic dysfunction of cervical region: Secondary | ICD-10-CM | POA: Diagnosis not present

## 2022-12-19 DIAGNOSIS — M5033 Other cervical disc degeneration, cervicothoracic region: Secondary | ICD-10-CM | POA: Diagnosis not present

## 2022-12-19 DIAGNOSIS — M9901 Segmental and somatic dysfunction of cervical region: Secondary | ICD-10-CM | POA: Diagnosis not present

## 2023-01-02 DIAGNOSIS — Z1231 Encounter for screening mammogram for malignant neoplasm of breast: Secondary | ICD-10-CM | POA: Diagnosis not present

## 2023-01-02 DIAGNOSIS — M5033 Other cervical disc degeneration, cervicothoracic region: Secondary | ICD-10-CM | POA: Diagnosis not present

## 2023-01-02 DIAGNOSIS — M9901 Segmental and somatic dysfunction of cervical region: Secondary | ICD-10-CM | POA: Diagnosis not present

## 2023-01-07 DIAGNOSIS — R928 Other abnormal and inconclusive findings on diagnostic imaging of breast: Secondary | ICD-10-CM | POA: Diagnosis not present

## 2023-01-07 DIAGNOSIS — N6489 Other specified disorders of breast: Secondary | ICD-10-CM | POA: Diagnosis not present

## 2023-01-16 DIAGNOSIS — M5033 Other cervical disc degeneration, cervicothoracic region: Secondary | ICD-10-CM | POA: Diagnosis not present

## 2023-01-16 DIAGNOSIS — M9901 Segmental and somatic dysfunction of cervical region: Secondary | ICD-10-CM | POA: Diagnosis not present

## 2023-01-18 DIAGNOSIS — M25572 Pain in left ankle and joints of left foot: Secondary | ICD-10-CM | POA: Diagnosis not present

## 2023-01-18 DIAGNOSIS — S93402A Sprain of unspecified ligament of left ankle, initial encounter: Secondary | ICD-10-CM | POA: Diagnosis not present

## 2023-01-21 DIAGNOSIS — Z13220 Encounter for screening for lipoid disorders: Secondary | ICD-10-CM | POA: Diagnosis not present

## 2023-01-21 DIAGNOSIS — Z0000 Encounter for general adult medical examination without abnormal findings: Secondary | ICD-10-CM | POA: Diagnosis not present

## 2023-01-30 DIAGNOSIS — M5033 Other cervical disc degeneration, cervicothoracic region: Secondary | ICD-10-CM | POA: Diagnosis not present

## 2023-01-30 DIAGNOSIS — M9901 Segmental and somatic dysfunction of cervical region: Secondary | ICD-10-CM | POA: Diagnosis not present

## 2023-02-06 DIAGNOSIS — M25572 Pain in left ankle and joints of left foot: Secondary | ICD-10-CM | POA: Diagnosis not present

## 2023-02-13 DIAGNOSIS — M5033 Other cervical disc degeneration, cervicothoracic region: Secondary | ICD-10-CM | POA: Diagnosis not present

## 2023-02-13 DIAGNOSIS — M9901 Segmental and somatic dysfunction of cervical region: Secondary | ICD-10-CM | POA: Diagnosis not present

## 2023-02-19 ENCOUNTER — Other Ambulatory Visit (HOSPITAL_COMMUNITY)
Admission: RE | Admit: 2023-02-19 | Discharge: 2023-02-19 | Disposition: A | Discharge status: Home / Self Care | Payer: BC Managed Care – PPO | Source: Ambulatory Visit | Attending: Obstetrics and Gynecology | Admitting: Obstetrics and Gynecology

## 2023-02-19 ENCOUNTER — Other Ambulatory Visit: Payer: Self-pay | Admitting: Obstetrics and Gynecology

## 2023-02-19 DIAGNOSIS — Z124 Encounter for screening for malignant neoplasm of cervix: Secondary | ICD-10-CM | POA: Diagnosis not present

## 2023-02-19 DIAGNOSIS — Z01419 Encounter for gynecological examination (general) (routine) without abnormal findings: Secondary | ICD-10-CM | POA: Diagnosis not present

## 2023-02-25 LAB — CYTOLOGY - PAP
Comment: NEGATIVE
Diagnosis: NEGATIVE
Diagnosis: REACTIVE
High risk HPV: NEGATIVE

## 2023-03-13 DIAGNOSIS — M9901 Segmental and somatic dysfunction of cervical region: Secondary | ICD-10-CM | POA: Diagnosis not present

## 2023-03-13 DIAGNOSIS — M5033 Other cervical disc degeneration, cervicothoracic region: Secondary | ICD-10-CM | POA: Diagnosis not present

## 2023-03-27 DIAGNOSIS — M5033 Other cervical disc degeneration, cervicothoracic region: Secondary | ICD-10-CM | POA: Diagnosis not present

## 2023-03-27 DIAGNOSIS — M9901 Segmental and somatic dysfunction of cervical region: Secondary | ICD-10-CM | POA: Diagnosis not present

## 2023-04-02 DIAGNOSIS — Z1211 Encounter for screening for malignant neoplasm of colon: Secondary | ICD-10-CM | POA: Diagnosis not present

## 2023-04-02 DIAGNOSIS — K5730 Diverticulosis of large intestine without perforation or abscess without bleeding: Secondary | ICD-10-CM | POA: Diagnosis not present

## 2023-04-17 DIAGNOSIS — M5033 Other cervical disc degeneration, cervicothoracic region: Secondary | ICD-10-CM | POA: Diagnosis not present

## 2023-04-17 DIAGNOSIS — M9901 Segmental and somatic dysfunction of cervical region: Secondary | ICD-10-CM | POA: Diagnosis not present

## 2023-04-22 DIAGNOSIS — M9901 Segmental and somatic dysfunction of cervical region: Secondary | ICD-10-CM | POA: Diagnosis not present

## 2023-04-22 DIAGNOSIS — M5033 Other cervical disc degeneration, cervicothoracic region: Secondary | ICD-10-CM | POA: Diagnosis not present

## 2023-05-09 DIAGNOSIS — M9901 Segmental and somatic dysfunction of cervical region: Secondary | ICD-10-CM | POA: Diagnosis not present

## 2023-05-09 DIAGNOSIS — M5033 Other cervical disc degeneration, cervicothoracic region: Secondary | ICD-10-CM | POA: Diagnosis not present

## 2023-05-22 DIAGNOSIS — M5033 Other cervical disc degeneration, cervicothoracic region: Secondary | ICD-10-CM | POA: Diagnosis not present

## 2023-05-22 DIAGNOSIS — M9901 Segmental and somatic dysfunction of cervical region: Secondary | ICD-10-CM | POA: Diagnosis not present

## 2023-06-05 DIAGNOSIS — M5033 Other cervical disc degeneration, cervicothoracic region: Secondary | ICD-10-CM | POA: Diagnosis not present

## 2023-06-05 DIAGNOSIS — M9901 Segmental and somatic dysfunction of cervical region: Secondary | ICD-10-CM | POA: Diagnosis not present

## 2023-06-19 DIAGNOSIS — M9901 Segmental and somatic dysfunction of cervical region: Secondary | ICD-10-CM | POA: Diagnosis not present

## 2023-06-19 DIAGNOSIS — M5033 Other cervical disc degeneration, cervicothoracic region: Secondary | ICD-10-CM | POA: Diagnosis not present

## 2023-07-03 DIAGNOSIS — M5033 Other cervical disc degeneration, cervicothoracic region: Secondary | ICD-10-CM | POA: Diagnosis not present

## 2023-07-03 DIAGNOSIS — M9901 Segmental and somatic dysfunction of cervical region: Secondary | ICD-10-CM | POA: Diagnosis not present

## 2023-07-17 DIAGNOSIS — M9901 Segmental and somatic dysfunction of cervical region: Secondary | ICD-10-CM | POA: Diagnosis not present

## 2023-07-17 DIAGNOSIS — M5033 Other cervical disc degeneration, cervicothoracic region: Secondary | ICD-10-CM | POA: Diagnosis not present

## 2023-07-31 DIAGNOSIS — M9901 Segmental and somatic dysfunction of cervical region: Secondary | ICD-10-CM | POA: Diagnosis not present

## 2023-07-31 DIAGNOSIS — M5033 Other cervical disc degeneration, cervicothoracic region: Secondary | ICD-10-CM | POA: Diagnosis not present

## 2023-08-14 DIAGNOSIS — M9901 Segmental and somatic dysfunction of cervical region: Secondary | ICD-10-CM | POA: Diagnosis not present

## 2023-08-14 DIAGNOSIS — M5033 Other cervical disc degeneration, cervicothoracic region: Secondary | ICD-10-CM | POA: Diagnosis not present

## 2023-09-25 DIAGNOSIS — M9901 Segmental and somatic dysfunction of cervical region: Secondary | ICD-10-CM | POA: Diagnosis not present

## 2023-09-25 DIAGNOSIS — M5033 Other cervical disc degeneration, cervicothoracic region: Secondary | ICD-10-CM | POA: Diagnosis not present

## 2023-10-08 DIAGNOSIS — M9901 Segmental and somatic dysfunction of cervical region: Secondary | ICD-10-CM | POA: Diagnosis not present

## 2023-10-08 DIAGNOSIS — M5033 Other cervical disc degeneration, cervicothoracic region: Secondary | ICD-10-CM | POA: Diagnosis not present

## 2023-10-23 DIAGNOSIS — M9901 Segmental and somatic dysfunction of cervical region: Secondary | ICD-10-CM | POA: Diagnosis not present

## 2023-10-23 DIAGNOSIS — M5033 Other cervical disc degeneration, cervicothoracic region: Secondary | ICD-10-CM | POA: Diagnosis not present

## 2023-11-06 DIAGNOSIS — M5033 Other cervical disc degeneration, cervicothoracic region: Secondary | ICD-10-CM | POA: Diagnosis not present

## 2023-11-06 DIAGNOSIS — M9901 Segmental and somatic dysfunction of cervical region: Secondary | ICD-10-CM | POA: Diagnosis not present

## 2023-11-20 DIAGNOSIS — M9901 Segmental and somatic dysfunction of cervical region: Secondary | ICD-10-CM | POA: Diagnosis not present

## 2023-11-20 DIAGNOSIS — M5033 Other cervical disc degeneration, cervicothoracic region: Secondary | ICD-10-CM | POA: Diagnosis not present

## 2023-12-04 DIAGNOSIS — M5033 Other cervical disc degeneration, cervicothoracic region: Secondary | ICD-10-CM | POA: Diagnosis not present

## 2023-12-04 DIAGNOSIS — M9901 Segmental and somatic dysfunction of cervical region: Secondary | ICD-10-CM | POA: Diagnosis not present

## 2023-12-18 DIAGNOSIS — M5033 Other cervical disc degeneration, cervicothoracic region: Secondary | ICD-10-CM | POA: Diagnosis not present

## 2023-12-18 DIAGNOSIS — M9901 Segmental and somatic dysfunction of cervical region: Secondary | ICD-10-CM | POA: Diagnosis not present

## 2024-01-01 DIAGNOSIS — M9901 Segmental and somatic dysfunction of cervical region: Secondary | ICD-10-CM | POA: Diagnosis not present

## 2024-01-01 DIAGNOSIS — M5033 Other cervical disc degeneration, cervicothoracic region: Secondary | ICD-10-CM | POA: Diagnosis not present

## 2024-01-08 DIAGNOSIS — Z1231 Encounter for screening mammogram for malignant neoplasm of breast: Secondary | ICD-10-CM | POA: Diagnosis not present

## 2024-01-15 DIAGNOSIS — M9901 Segmental and somatic dysfunction of cervical region: Secondary | ICD-10-CM | POA: Diagnosis not present

## 2024-01-15 DIAGNOSIS — M5033 Other cervical disc degeneration, cervicothoracic region: Secondary | ICD-10-CM | POA: Diagnosis not present

## 2024-01-27 DIAGNOSIS — Z0000 Encounter for general adult medical examination without abnormal findings: Secondary | ICD-10-CM | POA: Diagnosis not present

## 2024-01-27 DIAGNOSIS — Z13220 Encounter for screening for lipoid disorders: Secondary | ICD-10-CM | POA: Diagnosis not present

## 2024-01-29 DIAGNOSIS — M5033 Other cervical disc degeneration, cervicothoracic region: Secondary | ICD-10-CM | POA: Diagnosis not present

## 2024-01-29 DIAGNOSIS — M9901 Segmental and somatic dysfunction of cervical region: Secondary | ICD-10-CM | POA: Diagnosis not present

## 2024-02-13 DIAGNOSIS — M5033 Other cervical disc degeneration, cervicothoracic region: Secondary | ICD-10-CM | POA: Diagnosis not present

## 2024-02-13 DIAGNOSIS — M9901 Segmental and somatic dysfunction of cervical region: Secondary | ICD-10-CM | POA: Diagnosis not present

## 2024-02-26 DIAGNOSIS — M9901 Segmental and somatic dysfunction of cervical region: Secondary | ICD-10-CM | POA: Diagnosis not present

## 2024-02-26 DIAGNOSIS — M5033 Other cervical disc degeneration, cervicothoracic region: Secondary | ICD-10-CM | POA: Diagnosis not present

## 2024-02-28 DIAGNOSIS — Z01419 Encounter for gynecological examination (general) (routine) without abnormal findings: Secondary | ICD-10-CM | POA: Diagnosis not present

## 2024-03-11 DIAGNOSIS — M9901 Segmental and somatic dysfunction of cervical region: Secondary | ICD-10-CM | POA: Diagnosis not present

## 2024-03-11 DIAGNOSIS — M5033 Other cervical disc degeneration, cervicothoracic region: Secondary | ICD-10-CM | POA: Diagnosis not present

## 2024-03-25 DIAGNOSIS — M9901 Segmental and somatic dysfunction of cervical region: Secondary | ICD-10-CM | POA: Diagnosis not present

## 2024-03-25 DIAGNOSIS — M5033 Other cervical disc degeneration, cervicothoracic region: Secondary | ICD-10-CM | POA: Diagnosis not present

## 2024-04-15 DIAGNOSIS — M5033 Other cervical disc degeneration, cervicothoracic region: Secondary | ICD-10-CM | POA: Diagnosis not present

## 2024-04-15 DIAGNOSIS — M9901 Segmental and somatic dysfunction of cervical region: Secondary | ICD-10-CM | POA: Diagnosis not present

## 2024-04-21 DIAGNOSIS — M9901 Segmental and somatic dysfunction of cervical region: Secondary | ICD-10-CM | POA: Diagnosis not present

## 2024-04-21 DIAGNOSIS — M5033 Other cervical disc degeneration, cervicothoracic region: Secondary | ICD-10-CM | POA: Diagnosis not present

## 2024-04-29 DIAGNOSIS — M9901 Segmental and somatic dysfunction of cervical region: Secondary | ICD-10-CM | POA: Diagnosis not present

## 2024-04-29 DIAGNOSIS — M5033 Other cervical disc degeneration, cervicothoracic region: Secondary | ICD-10-CM | POA: Diagnosis not present

## 2024-05-11 DIAGNOSIS — M9901 Segmental and somatic dysfunction of cervical region: Secondary | ICD-10-CM | POA: Diagnosis not present

## 2024-05-11 DIAGNOSIS — M5033 Other cervical disc degeneration, cervicothoracic region: Secondary | ICD-10-CM | POA: Diagnosis not present

## 2024-05-21 DIAGNOSIS — M9901 Segmental and somatic dysfunction of cervical region: Secondary | ICD-10-CM | POA: Diagnosis not present

## 2024-05-21 DIAGNOSIS — M5033 Other cervical disc degeneration, cervicothoracic region: Secondary | ICD-10-CM | POA: Diagnosis not present

## 2024-05-27 DIAGNOSIS — N390 Urinary tract infection, site not specified: Secondary | ICD-10-CM | POA: Diagnosis not present

## 2024-06-17 DIAGNOSIS — M5033 Other cervical disc degeneration, cervicothoracic region: Secondary | ICD-10-CM | POA: Diagnosis not present

## 2024-06-17 DIAGNOSIS — M9901 Segmental and somatic dysfunction of cervical region: Secondary | ICD-10-CM | POA: Diagnosis not present

## 2024-07-01 DIAGNOSIS — M9901 Segmental and somatic dysfunction of cervical region: Secondary | ICD-10-CM | POA: Diagnosis not present

## 2024-07-01 DIAGNOSIS — M5033 Other cervical disc degeneration, cervicothoracic region: Secondary | ICD-10-CM | POA: Diagnosis not present

## 2024-07-15 DIAGNOSIS — M9901 Segmental and somatic dysfunction of cervical region: Secondary | ICD-10-CM | POA: Diagnosis not present

## 2024-07-15 DIAGNOSIS — M5033 Other cervical disc degeneration, cervicothoracic region: Secondary | ICD-10-CM | POA: Diagnosis not present

## 2024-08-12 DIAGNOSIS — M5033 Other cervical disc degeneration, cervicothoracic region: Secondary | ICD-10-CM | POA: Diagnosis not present

## 2024-08-12 DIAGNOSIS — M9901 Segmental and somatic dysfunction of cervical region: Secondary | ICD-10-CM | POA: Diagnosis not present

## 2024-08-26 DIAGNOSIS — M5033 Other cervical disc degeneration, cervicothoracic region: Secondary | ICD-10-CM | POA: Diagnosis not present

## 2024-08-26 DIAGNOSIS — M9901 Segmental and somatic dysfunction of cervical region: Secondary | ICD-10-CM | POA: Diagnosis not present

## 2024-09-09 DIAGNOSIS — M9901 Segmental and somatic dysfunction of cervical region: Secondary | ICD-10-CM | POA: Diagnosis not present

## 2024-09-09 DIAGNOSIS — M5033 Other cervical disc degeneration, cervicothoracic region: Secondary | ICD-10-CM | POA: Diagnosis not present
